# Patient Record
Sex: Female | Born: 1970 | Race: Black or African American | Hispanic: No | Marital: Single | Smoking: Never smoker
Health system: Southern US, Community
[De-identification: ages and names within clinical notes are randomized; demographics above are authoritative.]

## PROBLEM LIST (undated history)

## (undated) DIAGNOSIS — H9190 Unspecified hearing loss, unspecified ear: Secondary | ICD-10-CM

## (undated) DIAGNOSIS — F32A Depression, unspecified: Secondary | ICD-10-CM

## (undated) DIAGNOSIS — M1990 Unspecified osteoarthritis, unspecified site: Secondary | ICD-10-CM

## (undated) DIAGNOSIS — S82201C Unspecified fracture of shaft of right tibia, initial encounter for open fracture type IIIA, IIIB, or IIIC: Secondary | ICD-10-CM

## (undated) DIAGNOSIS — F419 Anxiety disorder, unspecified: Secondary | ICD-10-CM

## (undated) DIAGNOSIS — F329 Major depressive disorder, single episode, unspecified: Secondary | ICD-10-CM

## (undated) DIAGNOSIS — Z9289 Personal history of other medical treatment: Secondary | ICD-10-CM

## (undated) DIAGNOSIS — S82401C Unspecified fracture of shaft of right fibula, initial encounter for open fracture type IIIA, IIIB, or IIIC: Secondary | ICD-10-CM

## (undated) DIAGNOSIS — S42351A Displaced comminuted fracture of shaft of humerus, right arm, initial encounter for closed fracture: Secondary | ICD-10-CM

## (undated) HISTORY — PX: FRACTURE SURGERY: SHX138

## (undated) HISTORY — PX: GUM SURGERY: SHX658

---

## 2017-12-12 DIAGNOSIS — Z9289 Personal history of other medical treatment: Secondary | ICD-10-CM

## 2017-12-12 HISTORY — DX: Personal history of other medical treatment: Z92.89

## 2017-12-25 ENCOUNTER — Emergency Department (HOSPITAL_COMMUNITY): Payer: No Typology Code available for payment source

## 2017-12-25 ENCOUNTER — Other Ambulatory Visit: Payer: Self-pay

## 2017-12-25 ENCOUNTER — Encounter (HOSPITAL_COMMUNITY): Payer: Self-pay | Admitting: Emergency Medicine

## 2017-12-25 ENCOUNTER — Inpatient Hospital Stay (HOSPITAL_COMMUNITY)
Admission: EM | Admit: 2017-12-25 | Discharge: 2018-01-10 | DRG: 956 | Disposition: A | Discharge status: Rehabilitation Facility | Payer: No Typology Code available for payment source | Attending: General Surgery | Admitting: General Surgery

## 2017-12-25 DIAGNOSIS — S2249XA Multiple fractures of ribs, unspecified side, initial encounter for closed fracture: Secondary | ICD-10-CM | POA: Diagnosis not present

## 2017-12-25 DIAGNOSIS — G930 Cerebral cysts: Secondary | ICD-10-CM | POA: Diagnosis present

## 2017-12-25 DIAGNOSIS — Z6841 Body Mass Index (BMI) 40.0 and over, adult: Secondary | ICD-10-CM | POA: Diagnosis not present

## 2017-12-25 DIAGNOSIS — I6201 Nontraumatic acute subdural hemorrhage: Secondary | ICD-10-CM | POA: Diagnosis present

## 2017-12-25 DIAGNOSIS — S60511A Abrasion of right hand, initial encounter: Secondary | ICD-10-CM | POA: Diagnosis present

## 2017-12-25 DIAGNOSIS — S32029A Unspecified fracture of second lumbar vertebra, initial encounter for closed fracture: Secondary | ICD-10-CM | POA: Diagnosis present

## 2017-12-25 DIAGNOSIS — Z419 Encounter for procedure for purposes other than remedying health state, unspecified: Secondary | ICD-10-CM

## 2017-12-25 DIAGNOSIS — S299XXA Unspecified injury of thorax, initial encounter: Secondary | ICD-10-CM

## 2017-12-25 DIAGNOSIS — S82251B Displaced comminuted fracture of shaft of right tibia, initial encounter for open fracture type I or II: Principal | ICD-10-CM | POA: Diagnosis present

## 2017-12-25 DIAGNOSIS — T380X5A Adverse effect of glucocorticoids and synthetic analogues, initial encounter: Secondary | ICD-10-CM

## 2017-12-25 DIAGNOSIS — S42351A Displaced comminuted fracture of shaft of humerus, right arm, initial encounter for closed fracture: Secondary | ICD-10-CM | POA: Diagnosis present

## 2017-12-25 DIAGNOSIS — S36113A Laceration of liver, unspecified degree, initial encounter: Secondary | ICD-10-CM | POA: Diagnosis present

## 2017-12-25 DIAGNOSIS — S27321A Contusion of lung, unilateral, initial encounter: Secondary | ICD-10-CM | POA: Diagnosis present

## 2017-12-25 DIAGNOSIS — N179 Acute kidney failure, unspecified: Secondary | ICD-10-CM | POA: Diagnosis not present

## 2017-12-25 DIAGNOSIS — E872 Acidosis: Secondary | ICD-10-CM | POA: Diagnosis present

## 2017-12-25 DIAGNOSIS — S82251C Displaced comminuted fracture of shaft of right tibia, initial encounter for open fracture type IIIA, IIIB, or IIIC: Secondary | ICD-10-CM

## 2017-12-25 DIAGNOSIS — S62315A Displaced fracture of base of fourth metacarpal bone, left hand, initial encounter for closed fracture: Secondary | ICD-10-CM

## 2017-12-25 DIAGNOSIS — T148XXA Other injury of unspecified body region, initial encounter: Secondary | ICD-10-CM | POA: Diagnosis not present

## 2017-12-25 DIAGNOSIS — S271XXA Traumatic hemothorax, initial encounter: Secondary | ICD-10-CM | POA: Diagnosis present

## 2017-12-25 DIAGNOSIS — S82141B Displaced bicondylar fracture of right tibia, initial encounter for open fracture type I or II: Secondary | ICD-10-CM | POA: Diagnosis present

## 2017-12-25 DIAGNOSIS — R739 Hyperglycemia, unspecified: Secondary | ICD-10-CM

## 2017-12-25 DIAGNOSIS — S62333A Displaced fracture of neck of third metacarpal bone, left hand, initial encounter for closed fracture: Secondary | ICD-10-CM

## 2017-12-25 DIAGNOSIS — J90 Pleural effusion, not elsewhere classified: Secondary | ICD-10-CM | POA: Diagnosis not present

## 2017-12-25 DIAGNOSIS — J9601 Acute respiratory failure with hypoxia: Secondary | ICD-10-CM | POA: Diagnosis present

## 2017-12-25 DIAGNOSIS — S2239XA Fracture of one rib, unspecified side, initial encounter for closed fracture: Secondary | ICD-10-CM

## 2017-12-25 DIAGNOSIS — M21371 Foot drop, right foot: Secondary | ICD-10-CM | POA: Diagnosis present

## 2017-12-25 DIAGNOSIS — S2241XA Multiple fractures of ribs, right side, initial encounter for closed fracture: Secondary | ICD-10-CM | POA: Diagnosis present

## 2017-12-25 DIAGNOSIS — S27329A Contusion of lung, unspecified, initial encounter: Secondary | ICD-10-CM

## 2017-12-25 DIAGNOSIS — Z9911 Dependence on respirator [ventilator] status: Secondary | ICD-10-CM | POA: Diagnosis not present

## 2017-12-25 DIAGNOSIS — S32039A Unspecified fracture of third lumbar vertebra, initial encounter for closed fracture: Secondary | ICD-10-CM | POA: Diagnosis present

## 2017-12-25 DIAGNOSIS — S36116A Major laceration of liver, initial encounter: Secondary | ICD-10-CM | POA: Diagnosis present

## 2017-12-25 DIAGNOSIS — S36112A Contusion of liver, initial encounter: Secondary | ICD-10-CM | POA: Diagnosis present

## 2017-12-25 DIAGNOSIS — D62 Acute posthemorrhagic anemia: Secondary | ICD-10-CM

## 2017-12-25 DIAGNOSIS — S42309A Unspecified fracture of shaft of humerus, unspecified arm, initial encounter for closed fracture: Secondary | ICD-10-CM

## 2017-12-25 DIAGNOSIS — R578 Other shock: Secondary | ICD-10-CM | POA: Diagnosis present

## 2017-12-25 DIAGNOSIS — S82141A Displaced bicondylar fracture of right tibia, initial encounter for closed fracture: Secondary | ICD-10-CM

## 2017-12-25 DIAGNOSIS — D696 Thrombocytopenia, unspecified: Secondary | ICD-10-CM | POA: Diagnosis present

## 2017-12-25 DIAGNOSIS — I6200 Nontraumatic subdural hemorrhage, unspecified: Secondary | ICD-10-CM

## 2017-12-25 DIAGNOSIS — S62331A Displaced fracture of neck of second metacarpal bone, left hand, initial encounter for closed fracture: Secondary | ICD-10-CM

## 2017-12-25 DIAGNOSIS — G8918 Other acute postprocedural pain: Secondary | ICD-10-CM

## 2017-12-25 DIAGNOSIS — S60512A Abrasion of left hand, initial encounter: Secondary | ICD-10-CM | POA: Diagnosis present

## 2017-12-25 DIAGNOSIS — S82143A Displaced bicondylar fracture of unspecified tibia, initial encounter for closed fracture: Secondary | ICD-10-CM

## 2017-12-25 DIAGNOSIS — R571 Hypovolemic shock: Secondary | ICD-10-CM | POA: Diagnosis present

## 2017-12-25 DIAGNOSIS — S069X9A Unspecified intracranial injury with loss of consciousness of unspecified duration, initial encounter: Secondary | ICD-10-CM | POA: Diagnosis not present

## 2017-12-25 DIAGNOSIS — S065X9A Traumatic subdural hemorrhage with loss of consciousness of unspecified duration, initial encounter: Secondary | ICD-10-CM

## 2017-12-25 DIAGNOSIS — S62668A Nondisplaced fracture of distal phalanx of other finger, initial encounter for closed fracture: Secondary | ICD-10-CM | POA: Diagnosis present

## 2017-12-25 DIAGNOSIS — S81809A Unspecified open wound, unspecified lower leg, initial encounter: Secondary | ICD-10-CM

## 2017-12-25 DIAGNOSIS — J9690 Respiratory failure, unspecified, unspecified whether with hypoxia or hypercapnia: Secondary | ICD-10-CM

## 2017-12-25 DIAGNOSIS — S065XAA Traumatic subdural hemorrhage with loss of consciousness status unknown, initial encounter: Secondary | ICD-10-CM

## 2017-12-25 DIAGNOSIS — S82142A Displaced bicondylar fracture of left tibia, initial encounter for closed fracture: Secondary | ICD-10-CM

## 2017-12-25 DIAGNOSIS — D72829 Elevated white blood cell count, unspecified: Secondary | ICD-10-CM

## 2017-12-25 DIAGNOSIS — S8011XA Contusion of right lower leg, initial encounter: Secondary | ICD-10-CM | POA: Diagnosis present

## 2017-12-25 DIAGNOSIS — H9190 Unspecified hearing loss, unspecified ear: Secondary | ICD-10-CM | POA: Diagnosis present

## 2017-12-25 DIAGNOSIS — S82141C Displaced bicondylar fracture of right tibia, initial encounter for open fracture type IIIA, IIIB, or IIIC: Secondary | ICD-10-CM | POA: Diagnosis present

## 2017-12-25 HISTORY — DX: Unspecified fracture of shaft of right fibula, initial encounter for open fracture type IIIA, IIIB, or IIIC: S82.401C

## 2017-12-25 HISTORY — DX: Major depressive disorder, single episode, unspecified: F32.9

## 2017-12-25 HISTORY — DX: Unspecified hearing loss, unspecified ear: H91.90

## 2017-12-25 HISTORY — DX: Displaced comminuted fracture of shaft of humerus, right arm, initial encounter for closed fracture: S42.351A

## 2017-12-25 HISTORY — DX: Personal history of other medical treatment: Z92.89

## 2017-12-25 HISTORY — DX: Depression, unspecified: F32.A

## 2017-12-25 HISTORY — DX: Unspecified fracture of shaft of right tibia, initial encounter for open fracture type IIIA, IIIB, or IIIC: S82.201C

## 2017-12-25 HISTORY — DX: Unspecified osteoarthritis, unspecified site: M19.90

## 2017-12-25 HISTORY — DX: Anxiety disorder, unspecified: F41.9

## 2017-12-25 LAB — CBC
HCT: 33.9 % — ABNORMAL LOW (ref 36.0–46.0)
HEMOGLOBIN: 9.7 g/dL — AB (ref 12.0–15.0)
MCH: 29.4 pg (ref 26.0–34.0)
MCHC: 28.6 g/dL — ABNORMAL LOW (ref 30.0–36.0)
MCV: 102.7 fL — ABNORMAL HIGH (ref 78.0–100.0)
Platelets: 246 10*3/uL (ref 150–400)
RBC: 3.3 MIL/uL — AB (ref 3.87–5.11)
RDW: 12.8 % (ref 11.5–15.5)
WBC: 11.7 10*3/uL — AB (ref 4.0–10.5)

## 2017-12-25 LAB — URINALYSIS, ROUTINE W REFLEX MICROSCOPIC
BILIRUBIN URINE: NEGATIVE
GLUCOSE, UA: 150 mg/dL — AB
KETONES UR: NEGATIVE mg/dL
LEUKOCYTES UA: NEGATIVE
NITRITE: NEGATIVE
PH: 6 (ref 5.0–8.0)
Protein, ur: 100 mg/dL — AB
Specific Gravity, Urine: 1.032 — ABNORMAL HIGH (ref 1.005–1.030)

## 2017-12-25 LAB — I-STAT ARTERIAL BLOOD GAS, ED
Acid-base deficit: 16 mmol/L — ABNORMAL HIGH (ref 0.0–2.0)
Bicarbonate: 12.2 mmol/L — ABNORMAL LOW (ref 20.0–28.0)
O2 SAT: 98 %
PCO2 ART: 35.7 mmHg (ref 32.0–48.0)
PH ART: 7.139 — AB (ref 7.350–7.450)
TCO2: 13 mmol/L — AB (ref 22–32)
pO2, Arterial: 130 mmHg — ABNORMAL HIGH (ref 83.0–108.0)

## 2017-12-25 LAB — COMPREHENSIVE METABOLIC PANEL
ALT: 1012 U/L — ABNORMAL HIGH (ref 0–44)
ANION GAP: 24 — AB (ref 5–15)
AST: 943 U/L — ABNORMAL HIGH (ref 15–41)
Albumin: 3.3 g/dL — ABNORMAL LOW (ref 3.5–5.0)
Alkaline Phosphatase: 107 U/L (ref 38–126)
BILIRUBIN TOTAL: 0.5 mg/dL (ref 0.3–1.2)
BUN: 18 mg/dL (ref 6–20)
CO2: 12 mmol/L — AB (ref 22–32)
Calcium: 8.5 mg/dL — ABNORMAL LOW (ref 8.9–10.3)
Chloride: 104 mmol/L (ref 98–111)
Creatinine, Ser: 1.32 mg/dL — ABNORMAL HIGH (ref 0.44–1.00)
GFR calc non Af Amer: 47 mL/min — ABNORMAL LOW (ref >60)
GFR, EST AFRICAN AMERICAN: 54 mL/min — AB (ref >60)
Glucose, Bld: 294 mg/dL — ABNORMAL HIGH (ref 70–99)
Potassium: 4.2 mmol/L (ref 3.5–5.1)
SODIUM: 140 mmol/L (ref 135–145)
TOTAL PROTEIN: 6.6 g/dL (ref 6.5–8.1)

## 2017-12-25 LAB — I-STAT CG4 LACTIC ACID, ED
LACTIC ACID, VENOUS: 3.43 mmol/L — AB (ref 0.5–1.9)
Lactic Acid, Venous: 15.86 mmol/L (ref 0.5–1.9)

## 2017-12-25 LAB — PROTIME-INR
INR: 1.29
Prothrombin Time: 16 seconds — ABNORMAL HIGH (ref 11.4–15.2)

## 2017-12-25 LAB — ETHANOL: Alcohol, Ethyl (B): <10 mg/dL (ref <10)

## 2017-12-25 LAB — I-STAT CHEM 8, ED
BUN: 24 mg/dL — ABNORMAL HIGH (ref 6–20)
CALCIUM ION: 1.07 mmol/L — AB (ref 1.15–1.40)
Chloride: 105 mmol/L (ref 98–111)
Creatinine, Ser: 1.2 mg/dL — ABNORMAL HIGH (ref 0.44–1.00)
GLUCOSE: 278 mg/dL — AB (ref 70–99)
HCT: 31 % — ABNORMAL LOW (ref 36.0–46.0)
Hemoglobin: 10.5 g/dL — ABNORMAL LOW (ref 12.0–15.0)
Potassium: 4.3 mmol/L (ref 3.5–5.1)
Sodium: 139 mmol/L (ref 135–145)
TCO2: 15 mmol/L — ABNORMAL LOW (ref 22–32)

## 2017-12-25 LAB — ABO/RH: ABO/RH(D): O POS

## 2017-12-25 MED ORDER — TETANUS-DIPHTH-ACELL PERTUSSIS 5-2.5-18.5 LF-MCG/0.5 IM SUSP
INTRAMUSCULAR | Status: AC
  Filled 2017-12-25: qty 0.5

## 2017-12-25 MED ORDER — IOHEXOL 300 MG/ML  SOLN
100.0000 mL | Freq: Once | INTRAMUSCULAR | Status: AC | PRN
  Administered 2017-12-25: 100 mL via INTRAVENOUS

## 2017-12-25 MED ORDER — MIDAZOLAM HCL 2 MG/2ML IJ SOLN
INTRAMUSCULAR | Status: AC
  Administered 2017-12-25: 2 mg
  Filled 2017-12-25: qty 6

## 2017-12-25 MED ORDER — CHLORHEXIDINE GLUCONATE 0.12% ORAL RINSE (MEDLINE KIT)
15.0000 mL | Freq: Two times a day (BID) | OROMUCOSAL | Status: DC
  Administered 2017-12-25 – 2018-01-02 (×15): 15 mL via OROMUCOSAL

## 2017-12-25 MED ORDER — FENTANYL BOLUS VIA INFUSION
50.0000 ug | INTRAVENOUS | Status: DC | PRN
  Filled 2017-12-25: qty 50

## 2017-12-25 MED ORDER — PROPOFOL 1000 MG/100ML IV EMUL
INTRAVENOUS | Status: AC
  Filled 2017-12-25: qty 100

## 2017-12-25 MED ORDER — TRANEXAMIC ACID 1000 MG/10ML IV SOLN
1000.0000 mg | Freq: Once | INTRAVENOUS | Status: AC
  Administered 2017-12-25: 1000 mg via INTRAVENOUS
  Filled 2017-12-25: qty 10

## 2017-12-25 MED ORDER — PROPOFOL 1000 MG/100ML IV EMUL
0.0000 ug/kg/min | INTRAVENOUS | Status: DC
  Administered 2017-12-25: 5 ug/kg/min via INTRAVENOUS
  Administered 2017-12-26: 20 ug/kg/min via INTRAVENOUS
  Administered 2017-12-26: 5 ug/kg/min via INTRAVENOUS
  Administered 2017-12-27: 35 ug/kg/min via INTRAVENOUS
  Administered 2017-12-27 (×3): 15 ug/kg/min via INTRAVENOUS
  Administered 2017-12-28: 5 ug/kg/min via INTRAVENOUS
  Filled 2017-12-25 (×5): qty 100

## 2017-12-25 MED ORDER — POTASSIUM CHLORIDE IN NACL 20-0.9 MEQ/L-% IV SOLN
INTRAVENOUS | Status: DC
  Administered 2017-12-26 – 2018-01-06 (×14): via INTRAVENOUS
  Filled 2017-12-25 (×17): qty 1000

## 2017-12-25 MED ORDER — FENTANYL CITRATE (PF) 100 MCG/2ML IJ SOLN
INTRAMUSCULAR | Status: AC
  Administered 2017-12-25: 50 ug
  Filled 2017-12-25: qty 2

## 2017-12-25 MED ORDER — FENTANYL 2500MCG IN NS 250ML (10MCG/ML) PREMIX INFUSION
25.0000 ug/h | INTRAVENOUS | Status: DC
  Administered 2017-12-25: 25 ug/h via INTRAVENOUS
  Administered 2017-12-25: 50 ug/h via INTRAVENOUS
  Filled 2017-12-25: qty 250

## 2017-12-25 MED ORDER — SODIUM CHLORIDE 0.9 % IV SOLN
INTRAVENOUS | Status: AC | PRN
  Administered 2017-12-25: 1500 mL via INTRAVENOUS

## 2017-12-25 MED ORDER — DOCUSATE SODIUM 50 MG/5ML PO LIQD
100.0000 mg | Freq: Two times a day (BID) | ORAL | Status: DC | PRN
  Administered 2017-12-27 – 2018-01-01 (×5): 100 mg
  Filled 2017-12-25 (×6): qty 10

## 2017-12-25 MED ORDER — ETOMIDATE 2 MG/ML IV SOLN
INTRAVENOUS | Status: AC | PRN
  Administered 2017-12-25: 30 mg via INTRAVENOUS

## 2017-12-25 MED ORDER — FENTANYL BOLUS VIA INFUSION
50.0000 ug | INTRAVENOUS | Status: DC | PRN
  Administered 2017-12-26 – 2017-12-29 (×4): 50 ug via INTRAVENOUS
  Filled 2017-12-25 (×2): qty 50

## 2017-12-25 MED ORDER — SODIUM BICARBONATE 8.4 % IV SOLN
100.0000 meq | Freq: Once | INTRAVENOUS | Status: AC
  Administered 2017-12-25: 100 meq via INTRAVENOUS

## 2017-12-25 MED ORDER — CEFAZOLIN SODIUM-DEXTROSE 2-4 GM/100ML-% IV SOLN
INTRAVENOUS | Status: AC
  Administered 2017-12-25: 2000 mg
  Filled 2017-12-25: qty 100

## 2017-12-25 MED ORDER — TRANEXAMIC ACID 1000 MG/10ML IV SOLN
1000.0000 mg | INTRAVENOUS | Status: AC
  Administered 2017-12-25: 1000 mg via INTRAVENOUS
  Filled 2017-12-25: qty 10

## 2017-12-25 MED ORDER — NOREPINEPHRINE 4 MG/250ML-% IV SOLN
0.0000 ug/min | INTRAVENOUS | Status: DC
  Administered 2017-12-25 – 2017-12-26 (×2): 5 ug/min via INTRAVENOUS
  Filled 2017-12-25: qty 250

## 2017-12-25 MED ORDER — SODIUM BICARBONATE 8.4 % IV SOLN
INTRAVENOUS | Status: AC
  Filled 2017-12-25: qty 100

## 2017-12-25 MED ORDER — CEFAZOLIN SODIUM-DEXTROSE 1-4 GM/50ML-% IV SOLN
1.0000 g | Freq: Three times a day (TID) | INTRAVENOUS | Status: DC
  Administered 2017-12-26: 1 g via INTRAVENOUS
  Filled 2017-12-25: qty 50

## 2017-12-25 MED ORDER — FENTANYL CITRATE (PF) 100 MCG/2ML IJ SOLN
INTRAMUSCULAR | Status: AC | PRN
  Administered 2017-12-25: 75 ug via INTRAVENOUS

## 2017-12-25 MED ORDER — SUCCINYLCHOLINE CHLORIDE 20 MG/ML IJ SOLN
INTRAMUSCULAR | Status: AC | PRN
  Administered 2017-12-25: 150 mg via INTRAVENOUS

## 2017-12-25 MED ORDER — FENTANYL CITRATE (PF) 100 MCG/2ML IJ SOLN
50.0000 ug | Freq: Once | INTRAMUSCULAR | Status: AC
  Administered 2017-12-25: 50 ug via INTRAVENOUS

## 2017-12-25 MED ORDER — SODIUM CHLORIDE 0.9 % IV SOLN
INTRAVENOUS | Status: AC | PRN
  Administered 2017-12-25: 500 mL via INTRAVENOUS

## 2017-12-25 MED ORDER — FENTANYL CITRATE (PF) 100 MCG/2ML IJ SOLN
INTRAMUSCULAR | Status: AC
  Filled 2017-12-25: qty 2

## 2017-12-25 MED ORDER — ORAL CARE MOUTH RINSE
15.0000 mL | OROMUCOSAL | Status: DC
  Administered 2017-12-26 – 2018-01-02 (×71): 15 mL via OROMUCOSAL

## 2017-12-25 MED ORDER — TETANUS-DIPHTH-ACELL PERTUSSIS 5-2.5-18.5 LF-MCG/0.5 IM SUSP
0.5000 mL | Freq: Once | INTRAMUSCULAR | Status: AC
  Administered 2017-12-25: 0.5 mL via INTRAMUSCULAR

## 2017-12-25 MED ORDER — MIDAZOLAM HCL 2 MG/2ML IJ SOLN
INTRAMUSCULAR | Status: AC
  Filled 2017-12-25: qty 2

## 2017-12-25 MED ORDER — SODIUM CHLORIDE 0.9 % IV BOLUS
125.0000 mL | Freq: Once | INTRAVENOUS | Status: AC
  Administered 2017-12-25: 1000 mL via INTRAVENOUS

## 2017-12-25 MED ORDER — FENTANYL 2500MCG IN NS 250ML (10MCG/ML) PREMIX INFUSION
25.0000 ug/h | INTRAVENOUS | Status: DC
  Administered 2017-12-26: 50 ug/h via INTRAVENOUS
  Administered 2017-12-26: 175 ug/h via INTRAVENOUS
  Administered 2017-12-27 – 2017-12-28 (×2): 150 ug/h via INTRAVENOUS
  Administered 2017-12-29: 75 ug/h via INTRAVENOUS
  Administered 2017-12-30: 125 ug/h via INTRAVENOUS
  Administered 2017-12-31: 75 ug/h via INTRAVENOUS
  Administered 2017-12-31: 150 ug/h via INTRAVENOUS
  Administered 2018-01-02: 100 ug/h via INTRAVENOUS
  Filled 2017-12-25 (×9): qty 250
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
## 2017-12-25 NOTE — Progress Notes (Signed)
Orthopedic Tech Progress Note Patient Details:  Jerrel Ivoryarrows Sss Doe 05/14/1877 [id number]  Patient ID: Lynett FishNarrows Sss Doe, female   DOB: 05/14/1877, [age] y.o.   MRN: [id number]   [name], Leonte Horrigan 12/25/2017, 7:17 PM Made level 1 trauma visit

## 2017-12-26 ENCOUNTER — Inpatient Hospital Stay (HOSPITAL_COMMUNITY): Payer: No Typology Code available for payment source | Admitting: Certified Registered Nurse Anesthetist

## 2017-12-26 ENCOUNTER — Inpatient Hospital Stay (HOSPITAL_COMMUNITY): Payer: No Typology Code available for payment source

## 2017-12-26 ENCOUNTER — Encounter (HOSPITAL_COMMUNITY): Admission: EM | Disposition: A | Discharge status: Rehabilitation Facility | Payer: Self-pay | Source: Home / Self Care

## 2017-12-26 HISTORY — PX: EXTERNAL FIXATION LEG: SHX1549

## 2017-12-26 HISTORY — PX: I & D EXTREMITY: SHX5045

## 2017-12-26 LAB — CBC
HCT: 32.8 % — ABNORMAL LOW (ref 36.0–46.0)
HCT: 24.7 % — ABNORMAL LOW (ref 36.0–46.0)
HEMATOCRIT: 33.5 % — AB (ref 36.0–46.0)
HEMATOCRIT: 33.5 % — AB (ref 36.0–46.0)
HEMOGLOBIN: 11.1 g/dL — AB (ref 12.0–15.0)
HEMOGLOBIN: 11 g/dL — AB (ref 12.0–15.0)
HEMOGLOBIN: 8 g/dL — AB (ref 12.0–15.0)
Hemoglobin: 10.9 g/dL — ABNORMAL LOW (ref 12.0–15.0)
MCH: 29.6 pg (ref 26.0–34.0)
MCH: 29.2 pg (ref 26.0–34.0)
MCH: 29.3 pg (ref 26.0–34.0)
MCH: 29.2 pg (ref 26.0–34.0)
MCHC: 33.1 g/dL (ref 30.0–36.0)
MCHC: 32.8 g/dL (ref 30.0–36.0)
MCHC: 33.2 g/dL (ref 30.0–36.0)
MCHC: 32.4 g/dL (ref 30.0–36.0)
MCV: 89.3 fL (ref 78.0–100.0)
MCV: 88.9 fL (ref 78.0–100.0)
MCV: 88.2 fL (ref 78.0–100.0)
MCV: 90.1 fL (ref 78.0–100.0)
PLATELETS: 152 10*3/uL (ref 150–400)
PLATELETS: DECREASED 10*3/uL (ref 150–400)
Platelets: 168 10*3/uL (ref 150–400)
Platelets: 168 10*3/uL (ref 150–400)
RBC: 3.75 MIL/uL — ABNORMAL LOW (ref 3.87–5.11)
RBC: 3.77 MIL/uL — AB (ref 3.87–5.11)
RBC: 3.72 MIL/uL — ABNORMAL LOW (ref 3.87–5.11)
RBC: 2.74 MIL/uL — AB (ref 3.87–5.11)
RDW: 13.7 % (ref 11.5–15.5)
RDW: 14.1 % (ref 11.5–15.5)
RDW: 14.6 % (ref 11.5–15.5)
RDW: 15 % (ref 11.5–15.5)
WBC: 12.7 10*3/uL — AB (ref 4.0–10.5)
WBC: 12.3 10*3/uL — ABNORMAL HIGH (ref 4.0–10.5)
WBC: 12.4 10*3/uL — AB (ref 4.0–10.5)
WBC: 8.5 10*3/uL (ref 4.0–10.5)

## 2017-12-26 LAB — PREPARE PLATELET PHERESIS: Unit division: 0

## 2017-12-26 LAB — COMPREHENSIVE METABOLIC PANEL
ALBUMIN: 2.9 g/dL — AB (ref 3.5–5.0)
ALBUMIN: 2.8 g/dL — AB (ref 3.5–5.0)
ALK PHOS: 64 U/L (ref 38–126)
ALT: 718 U/L — AB (ref 0–44)
ALT: 738 U/L — AB (ref 0–44)
ANION GAP: 9 (ref 5–15)
AST: 1071 U/L — ABNORMAL HIGH (ref 15–41)
AST: 1194 U/L — AB (ref 15–41)
Alkaline Phosphatase: 61 U/L (ref 38–126)
Anion gap: 10 (ref 5–15)
BILIRUBIN TOTAL: 0.9 mg/dL (ref 0.3–1.2)
BILIRUBIN TOTAL: 0.9 mg/dL (ref 0.3–1.2)
BUN: 16 mg/dL (ref 6–20)
BUN: 16 mg/dL (ref 6–20)
CALCIUM: 6.8 mg/dL — AB (ref 8.9–10.3)
CALCIUM: 6.8 mg/dL — AB (ref 8.9–10.3)
CO2: 24 mmol/L (ref 22–32)
CO2: 24 mmol/L (ref 22–32)
CREATININE: 1.32 mg/dL — AB (ref 0.44–1.00)
Chloride: 111 mmol/L (ref 98–111)
Chloride: 107 mmol/L (ref 98–111)
Creatinine, Ser: 1.45 mg/dL — ABNORMAL HIGH (ref 0.44–1.00)
GFR calc Af Amer: 48 mL/min — ABNORMAL LOW (ref >60)
GFR calc non Af Amer: 47 mL/min — ABNORMAL LOW (ref >60)
GFR calc non Af Amer: 42 mL/min — ABNORMAL LOW (ref >60)
GFR, EST AFRICAN AMERICAN: 54 mL/min — AB (ref >60)
GLUCOSE: 169 mg/dL — AB (ref 70–99)
GLUCOSE: 228 mg/dL — AB (ref 70–99)
Potassium: 4 mmol/L (ref 3.5–5.1)
Potassium: 4 mmol/L (ref 3.5–5.1)
Sodium: 144 mmol/L (ref 135–145)
Sodium: 141 mmol/L (ref 135–145)
TOTAL PROTEIN: 5.1 g/dL — AB (ref 6.5–8.1)
TOTAL PROTEIN: 5.2 g/dL — AB (ref 6.5–8.1)

## 2017-12-26 LAB — PROTIME-INR
INR: 1.29
INR: 1.26
Prothrombin Time: 16 seconds — ABNORMAL HIGH (ref 11.4–15.2)
Prothrombin Time: 15.7 seconds — ABNORMAL HIGH (ref 11.4–15.2)

## 2017-12-26 LAB — BPAM PLATELET PHERESIS
Blood Product Expiration Date: 201908152359
ISSUE DATE / TIME: 201908142108
Unit Type and Rh: 6200

## 2017-12-26 LAB — BPAM FFP
BLOOD PRODUCT EXPIRATION DATE: 201908182359
BLOOD PRODUCT EXPIRATION DATE: 201908182359
BLOOD PRODUCT EXPIRATION DATE: 201908182359
Blood Product Expiration Date: 201908182359
Blood Product Expiration Date: 201908182359
Blood Product Expiration Date: 201908182359
ISSUE DATE / TIME: 201908141901
ISSUE DATE / TIME: 201908142024
ISSUE DATE / TIME: 201908142024
ISSUE DATE / TIME: 201908142108
ISSUE DATE / TIME: 201908141901
ISSUE DATE / TIME: 201908142108
UNIT TYPE AND RH: 600
UNIT TYPE AND RH: 6200
UNIT TYPE AND RH: 6200
Unit Type and Rh: 6200
Unit Type and Rh: 6200
Unit Type and Rh: 6200

## 2017-12-26 LAB — POCT I-STAT 3, ART BLOOD GAS (G3+)
ACID-BASE DEFICIT: 7 mmol/L — AB (ref 0.0–2.0)
Bicarbonate: 20.1 mmol/L (ref 20.0–28.0)
O2 SAT: 100 %
PO2 ART: 314 mmHg — AB (ref 83.0–108.0)
Patient temperature: 37.1
TCO2: 21 mmol/L — ABNORMAL LOW (ref 22–32)
pCO2 arterial: 45.8 mmHg (ref 32.0–48.0)
pH, Arterial: 7.25 — ABNORMAL LOW (ref 7.350–7.450)

## 2017-12-26 LAB — PREPARE FRESH FROZEN PLASMA
UNIT DIVISION: 0
UNIT DIVISION: 0
Unit division: 0

## 2017-12-26 LAB — LACTIC ACID, PLASMA
Lactic Acid, Venous: 3.1 mmol/L (ref 0.5–1.9)
Lactic Acid, Venous: 2.3 mmol/L (ref 0.5–1.9)

## 2017-12-26 LAB — BLOOD PRODUCT ORDER (VERBAL) VERIFICATION

## 2017-12-26 LAB — TRIGLYCERIDES: Triglycerides: 103 mg/dL (ref <150)

## 2017-12-26 LAB — CDS SEROLOGY

## 2017-12-26 LAB — GLUCOSE, CAPILLARY: GLUCOSE-CAPILLARY: 125 mg/dL — AB (ref 70–99)

## 2017-12-26 LAB — SURGICAL PCR SCREEN
MRSA, PCR: NEGATIVE
Staphylococcus aureus: NEGATIVE

## 2017-12-26 LAB — MRSA PCR SCREENING: MRSA BY PCR: NEGATIVE

## 2017-12-26 SURGERY — EXTERNAL FIXATION, LOWER EXTREMITY
Anesthesia: General | Laterality: Right

## 2017-12-26 MED ORDER — INSULIN ASPART 100 UNIT/ML ~~LOC~~ SOLN
0.0000 [IU] | SUBCUTANEOUS | Status: DC
  Administered 2017-12-26: 1 [IU] via SUBCUTANEOUS
  Administered 2017-12-28 – 2017-12-31 (×9): 2 [IU] via SUBCUTANEOUS
  Administered 2018-01-01: 3 [IU] via SUBCUTANEOUS
  Administered 2018-01-02 – 2018-01-05 (×6): 2 [IU] via SUBCUTANEOUS
  Administered 2018-01-05: 3 [IU] via SUBCUTANEOUS
  Administered 2018-01-06 – 2018-01-08 (×3): 2 [IU] via SUBCUTANEOUS

## 2017-12-26 MED ORDER — LACTATED RINGERS IV SOLN
INTRAVENOUS | Status: DC | PRN
  Administered 2017-12-26: 14:00:00 via INTRAVENOUS

## 2017-12-26 MED ORDER — VANCOMYCIN HCL 1000 MG IV SOLR
INTRAVENOUS | Status: AC
  Filled 2017-12-26: qty 1000

## 2017-12-26 MED ORDER — TOBRAMYCIN SULFATE 1.2 G IJ SOLR
INTRAMUSCULAR | Status: DC | PRN
  Administered 2017-12-26 (×2): 1.2 g

## 2017-12-26 MED ORDER — PHENYLEPHRINE HCL-NACL 10-0.9 MG/250ML-% IV SOLN
0.0000 ug/min | INTRAVENOUS | Status: DC
  Administered 2017-12-27: 70 ug/min via INTRAVENOUS
  Administered 2017-12-27: 100 ug/min via INTRAVENOUS
  Administered 2017-12-27: 85 ug/min via INTRAVENOUS
  Administered 2017-12-27: 30 ug/min via INTRAVENOUS
  Administered 2017-12-27: 80 ug/min via INTRAVENOUS
  Filled 2017-12-26 (×5): qty 250

## 2017-12-26 MED ORDER — ALBUMIN HUMAN 5 % IV SOLN
INTRAVENOUS | Status: DC | PRN
  Administered 2017-12-26: 15:00:00 via INTRAVENOUS

## 2017-12-26 MED ORDER — ROCURONIUM BROMIDE 50 MG/5ML IV SOSY
PREFILLED_SYRINGE | INTRAVENOUS | Status: AC
  Filled 2017-12-26: qty 10

## 2017-12-26 MED ORDER — SODIUM CHLORIDE 0.9 % IV SOLN
INTRAVENOUS | Status: DC | PRN
  Administered 2017-12-26: 40 ug/min via INTRAVENOUS
  Administered 2017-12-26: 20 ug/min via INTRAVENOUS

## 2017-12-26 MED ORDER — CHLORHEXIDINE GLUCONATE 0.12 % MT SOLN
OROMUCOSAL | Status: AC
  Filled 2017-12-26: qty 15

## 2017-12-26 MED ORDER — MIDAZOLAM HCL 2 MG/2ML IJ SOLN
INTRAMUSCULAR | Status: AC
  Filled 2017-12-26: qty 2

## 2017-12-26 MED ORDER — 0.9 % SODIUM CHLORIDE (POUR BTL) OPTIME
TOPICAL | Status: DC | PRN
  Administered 2017-12-26: 2000 mL

## 2017-12-26 MED ORDER — VANCOMYCIN HCL 1000 MG IV SOLR
INTRAVENOUS | Status: DC | PRN
  Administered 2017-12-26 (×3): 1000 mg

## 2017-12-26 MED ORDER — MUPIROCIN 2 % EX OINT: 1.0000 "application " | TOPICAL_OINTMENT | Freq: Two times a day (BID) | CUTANEOUS | Status: DC

## 2017-12-26 MED ORDER — ONDANSETRON HCL 4 MG/2ML IJ SOLN
INTRAMUSCULAR | Status: AC
Start: 2017-12-26 — End: ?
  Filled 2017-12-26: qty 2

## 2017-12-26 MED ORDER — TOBRAMYCIN SULFATE 1.2 G IJ SOLR
INTRAMUSCULAR | Status: AC
  Filled 2017-12-26: qty 2.4

## 2017-12-26 MED ORDER — SODIUM CHLORIDE 0.9 % IR SOLN
Status: DC | PRN
  Administered 2017-12-26: 12000 mL

## 2017-12-26 MED ORDER — PHENYLEPHRINE 40 MCG/ML (10ML) SYRINGE FOR IV PUSH (FOR BLOOD PRESSURE SUPPORT)
PREFILLED_SYRINGE | INTRAVENOUS | Status: DC | PRN
  Administered 2017-12-26: 80 ug via INTRAVENOUS
  Administered 2017-12-26 (×4): 120 ug via INTRAVENOUS

## 2017-12-26 MED ORDER — ROCURONIUM BROMIDE 10 MG/ML (PF) SYRINGE
PREFILLED_SYRINGE | INTRAVENOUS | Status: DC | PRN
  Administered 2017-12-26: 20 mg via INTRAVENOUS
  Administered 2017-12-26: 10 mg via INTRAVENOUS
  Administered 2017-12-26 (×2): 20 mg via INTRAVENOUS
  Administered 2017-12-26: 50 mg via INTRAVENOUS

## 2017-12-26 MED ORDER — ROCURONIUM BROMIDE 50 MG/5ML IV SOSY
PREFILLED_SYRINGE | INTRAVENOUS | Status: AC
Start: 2017-12-26 — End: ?
  Filled 2017-12-26: qty 5

## 2017-12-26 MED ORDER — CEFAZOLIN SODIUM-DEXTROSE 2-4 GM/100ML-% IV SOLN
2.0000 g | Freq: Three times a day (TID) | INTRAVENOUS | Status: DC
  Administered 2017-12-26: 2 g via INTRAVENOUS
  Filled 2017-12-26 (×2): qty 100

## 2017-12-26 MED ORDER — VANCOMYCIN HCL 1000 MG IV SOLR
INTRAVENOUS | Status: AC
  Filled 2017-12-26: qty 2000

## 2017-12-26 MED ORDER — SODIUM CHLORIDE 0.9 % IV SOLN
2.0000 g | INTRAVENOUS | Status: DC
  Administered 2017-12-26 – 2018-01-01 (×7): 2 g via INTRAVENOUS
  Filled 2017-12-26 (×7): qty 20

## 2017-12-26 MED ORDER — ALBUMIN HUMAN 5 % IV SOLN
25.0000 g | Freq: Once | INTRAVENOUS | Status: AC
  Administered 2017-12-26: 25 g via INTRAVENOUS
  Filled 2017-12-26: qty 500

## 2017-12-26 MED ORDER — MIDAZOLAM HCL 2 MG/2ML IJ SOLN
INTRAMUSCULAR | Status: DC | PRN
  Administered 2017-12-26: 2 mg via INTRAVENOUS

## 2017-12-26 MED ORDER — PHENYLEPHRINE 40 MCG/ML (10ML) SYRINGE FOR IV PUSH (FOR BLOOD PRESSURE SUPPORT)
PREFILLED_SYRINGE | INTRAVENOUS | Status: AC
  Filled 2017-12-26: qty 20

## 2017-12-26 SURGICAL SUPPLY — 55 items
BNDG COHESIVE 4X5 TAN STRL (GAUZE/BANDAGES/DRESSINGS) ×3 IMPLANT
BNDG CONFORM 2 STRL LF (GAUZE/BANDAGES/DRESSINGS) ×3 IMPLANT
BNDG GAUZE ELAST 4 BULKY (GAUZE/BANDAGES/DRESSINGS) ×3 IMPLANT
BNDG STRETCH 4X75 NS LF (GAUZE/BANDAGES/DRESSINGS) ×3 IMPLANT
BOWL SMART MIX CTS (DISPOSABLE) ×3 IMPLANT
CEMENT HV SMART SET (Cement) ×3 IMPLANT
CHLORAPREP W/TINT 26ML (MISCELLANEOUS) ×6 IMPLANT
CLAMP LG MULTI PIN (Clamp) ×6 IMPLANT
CLAMP ROD ATTACHMENT (Clamp) ×6 IMPLANT
COVER SURGICAL LIGHT HANDLE (MISCELLANEOUS) ×3 IMPLANT
DRAPE C-ARM 42X72 X-RAY (DRAPES) ×3 IMPLANT
DRAPE C-ARMOR (DRAPES) ×3 IMPLANT
DRAPE ORTHO SPLIT 77X108 STRL (DRAPES) ×4
DRAPE SURG 17X23 STRL (DRAPES) ×3 IMPLANT
DRAPE SURG ORHT 6 SPLT 77X108 (DRAPES) ×2 IMPLANT
DRAPE U-SHAPE 47X51 STRL (DRAPES) ×3 IMPLANT
DRESSING VERAFLO CLEANSE CC (GAUZE/BANDAGES/DRESSINGS) ×1 IMPLANT
DRSG MEPITEL 4X7.2 (GAUZE/BANDAGES/DRESSINGS) ×6 IMPLANT
DRSG VAC ATS LRG SENSATRAC (GAUZE/BANDAGES/DRESSINGS) ×3 IMPLANT
DRSG VERAFLO CLEANSE CC (GAUZE/BANDAGES/DRESSINGS) ×3
ELECT REM PT RETURN 9FT ADLT (ELECTROSURGICAL) ×3
ELECTRODE REM PT RTRN 9FT ADLT (ELECTROSURGICAL) ×1 IMPLANT
GAUZE SPONGE 4X4 12PLY STRL (GAUZE/BANDAGES/DRESSINGS) ×3 IMPLANT
GLOVE BIO SURGEON STRL SZ 6.5 (GLOVE) ×4 IMPLANT
GLOVE BIO SURGEON STRL SZ7.5 (GLOVE) ×18 IMPLANT
GLOVE BIO SURGEONS STRL SZ 6.5 (GLOVE) ×2
GLOVE BIOGEL PI IND STRL 7.5 (GLOVE) ×4 IMPLANT
GLOVE BIOGEL PI INDICATOR 7.5 (GLOVE) ×8
GOWN STRL REUS W/ TWL LRG LVL3 (GOWN DISPOSABLE) ×2 IMPLANT
GOWN STRL REUS W/TWL LRG LVL3 (GOWN DISPOSABLE) ×4
HANDPIECE INTERPULSE COAX TIP (DISPOSABLE) ×2
IV NS IRRIG 3000ML ARTHROMATIC (IV SOLUTION) ×12 IMPLANT
KIT BASIN OR (CUSTOM PROCEDURE TRAY) ×3 IMPLANT
KIT TURNOVER KIT B (KITS) ×3 IMPLANT
MANIFOLD NEPTUNE II (INSTRUMENTS) ×3 IMPLANT
NEEDLE 22X1 1/2 (OR ONLY) (NEEDLE) IMPLANT
NS IRRIG 1000ML POUR BTL (IV SOLUTION) ×6 IMPLANT
PACK GENERAL/GYN (CUSTOM PROCEDURE TRAY) ×3 IMPLANT
PACK ORTHO EXTREMITY (CUSTOM PROCEDURE TRAY) ×3 IMPLANT
PAD ARMBOARD 7.5X6 YLW CONV (MISCELLANEOUS) ×6 IMPLANT
ROD CARBON FIBER 500MM (Rod) ×6 IMPLANT
SCREW SCHNZ SD 5.0 60 THRD/150 (Screw) ×2 IMPLANT
SCREW SCHNZ SD 5.0 80 THRD/200 (Screw) ×2 IMPLANT
SCRW SCHANZ SD 5.0 60 THRD/150 (Screw) ×6 IMPLANT
SCRW SCHANZ SD 5.0 80 THRD/200 (Screw) ×6 IMPLANT
SET HNDPC FAN SPRY TIP SCT (DISPOSABLE) ×1 IMPLANT
SPONGE LAP 18X18 X RAY DECT (DISPOSABLE) ×3 IMPLANT
SUT ETHILON 2 0 PSLX (SUTURE) ×6 IMPLANT
SUT PDS AB 1 CTX 36 (SUTURE) ×3 IMPLANT
SUT PDS AB 2-0 CT1 27 (SUTURE) ×3 IMPLANT
TOWEL OR 17X24 6PK STRL BLUE (TOWEL DISPOSABLE) ×6 IMPLANT
TOWEL OR 17X26 10 PK STRL BLUE (TOWEL DISPOSABLE) ×6 IMPLANT
TUBE CONNECTING 12'X1/4 (SUCTIONS) ×1
TUBE CONNECTING 12X1/4 (SUCTIONS) ×2 IMPLANT
UNDERPAD 30X30 (UNDERPADS AND DIAPERS) ×3 IMPLANT

## 2017-12-26 NOTE — Progress Notes (Signed)
Inpatient Diabetes Program Recommendations  AACE/ADA: New Consensus Statement on Inpatient Glycemic Control (2015)  Target Ranges:  Prepandial:   less than 140 mg/dL      Peak postprandial:   less than 180 mg/dL (1-2 hours)      Critically ill patients:  140 - 180 mg/dL   No results found for: GLUCAP, HGBA1C  Review of Glycemic Control Results for Dorothy Wilson, Dorothy Wilson (MRN [id number]) as of 12/26/2017 13:42  Ref. Range 12/25/2017 19:27 12/25/2017 19:38 12/26/2017 01:14 12/26/2017 04:45  Glucose Latest Ref Range: 70 - 99 mg/dL 409294 (H) 811278 (H) 914169 (H) 228 (H)   Diabetes history: No history noted Outpatient Diabetes medications: None Inpatient Diabetes Program Recommendations:   Note elevated blood sugars.  If appropriate may consider ICU glycemic control order set- sensitive q 4 hours.    Thanks,  Beryl MeagerJenny Maleak Brazzel, RN, BC-ADM Inpatient Diabetes Coordinator Pager [phone] (8a-5p)
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
## 2017-12-26 NOTE — Transfer of Care (Signed)
Immediate Anesthesia Transfer of Care Note  Patient: Dorothy Wilson  Procedure(s) Performed: EXTERNAL FIXATION LEG (Right ) IRRIGATION AND DEBRIDEMENT EXTREMITY (Right )  Patient Location: ICU  Anesthesia Type:General  Level of Consciousness: sedated, patient cooperative and Patient remains intubated per anesthesia plan  Airway & Oxygen Therapy: Patient remains intubated per anesthesia plan and Patient placed on Ventilator (see vital sign flow sheet for setting)  Post-op Assessment: Report given to RN and Post -op Vital signs reviewed and stable  Post vital signs: Reviewed and stable  Last Vitals:  Vitals Value Taken Time  BP 115/65 12/26/2017  5:07 PM  Temp 36.8 C 12/26/2017  5:12 PM  Pulse 111 12/26/2017  5:11 PM  Resp 16 12/26/2017  5:12 PM  SpO2 98 % 12/26/2017  5:11 PM  Vitals shown include unvalidated device data.  Last Pain:  Vitals:   12/25/17 2230  TempSrc:   PainSc: 10-Worst pain ever         Complications: No apparent anesthesia complications

## 2017-12-26 NOTE — ED Notes (Signed)
Family updated as to patient's status via phone by Dr Lindie SpruceWyatt.  Dorothy PillowJennifer Wilson [phone], Dorothy Wilson [phone].

## 2017-12-26 NOTE — ED Notes (Signed)
O negative blood at bedside.

## 2017-12-26 NOTE — ED Notes (Signed)
Swelling to upper right forearm, open fracture right knee, bleeding active.  Road rash on abdomen, left knee and multiple small areas around body.  Bilateral hand swelling and multiple finger swelling.  Swelling to left foot.

## 2017-12-26 NOTE — Anesthesia Postprocedure Evaluation (Signed)
Anesthesia Post Note  Patient: Dorothy Wilson  Procedure(s) Performed: EXTERNAL FIXATION LEG (Right ) IRRIGATION AND DEBRIDEMENT EXTREMITY (Right )     Patient location during evaluation: SICU Anesthesia Type: General Level of consciousness: sedated Pain management: pain level controlled Vital Signs Assessment: post-procedure vital signs reviewed and stable Respiratory status: patient remains intubated per anesthesia plan Cardiovascular status: stable Postop Assessment: no apparent nausea or vomiting Anesthetic complications: no    Last Vitals:  Vitals:   12/26/17 1109 12/26/17 1200  BP: 112/68 (!) 98/59  Pulse: (!) 121 (!) 108  Resp: 19 16  Temp: (!) 38.3 C (!) 38.1 C  SpO2: 96% 100%    Last Pain:  Vitals:   12/25/17 2230  TempSrc:   PainSc: 10-Worst pain ever                 Dicky Boer,W. EDMOND
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
## 2017-12-26 NOTE — Anesthesia Preprocedure Evaluation (Addendum)
Anesthesia Evaluation  Patient identified by MRN, date of birth, ID band Patient awake    Reviewed: Allergy & Precautions, H&P , NPO status , Patient's Chart, lab work & pertinent test results, Unable to perform ROS - Chart review only  History of Anesthesia Complications Negative for: history of anesthetic complications  Airway Mallampati: Intubated       Dental no notable dental hx. (+) Teeth Intact, Dental Advisory Given   Pulmonary neg pulmonary ROS,    Pulmonary exam normal        Cardiovascular  Rhythm:Regular Rate:Normal     Neuro/Psych  Deafness  negative neurological ROS  negative psych ROS   GI/Hepatic negative GI ROS, Neg liver ROS,   Endo/Other  Morbid obesity  Renal/GU negative Renal ROS  negative genitourinary   Musculoskeletal negative musculoskeletal ROS (+)   Abdominal (+) + obese,   Peds  Hematology negative hematology ROS (+)   Anesthesia Other Findings Traumatic injuries 1. Multiple right rib fractures with tiny CT only PTX, pulmonary contusion, small hemothorax; 2.  Right hepatic lobe Grade II contusion and laceration with small amoount of perisplenic and perihepatic blood.  Some blood in the pelvis.  No active extravasation.  Positive FAST; 3.  Open, comminuted right tibial plateau fx with bleeding. 4.  Closed comminuted mid-shaft right humerus fracture; 5.  Possible bilateral hand fractures and possible left foot fracture; 6.  Arachnoid cyst left parietal lobe with some bleeding inside of cyst  Reproductive/Obstetrics negative OB ROS                             Anesthesia Physical  Anesthesia Plan  ASA: IV  Anesthesia Plan: General   Post-op Pain Management:    Induction: Inhalational  PONV Risk Score and Plan: 3 and Treatment may vary due to age or medical condition  Airway Management Planned: Oral ETT  Additional Equipment: None  Intra-op Plan:    Post-operative Plan: Post-operative intubation/ventilation  Informed Consent: I have reviewed the patients History and Physical, chart, labs and discussed the procedure including the risks, benefits and alternatives for the proposed anesthesia with the patient or authorized representative who has indicated his/her understanding and acceptance.   Dental advisory given  Plan Discussed with: CRNA and Anesthesiologist  Anesthesia Plan Comments:         Anesthesia Quick Evaluation  

## 2017-12-26 NOTE — Progress Notes (Signed)
Pt sister Rancy (sp?) called from out of town for update. She was able to inform nursing pt is able to read lips and respond back when at her baseline. She also stated pt mother, Imelda PillowJennifer Axel, will be arriving sometime late morning 12/26/17 from out of town. Pt boyfriend, Denyse AmassCorey, also may be coming but she is unsure. There is another sibling Tolani that is also from out of town.

## 2017-12-26 NOTE — ED Notes (Signed)
To CT with RN, EMT and Trauma Surgeon.

## 2017-12-26 NOTE — Progress Notes (Signed)
Patient ID: Dorothy Wilson, female   DOB: Jun 10, 1970, 47 y.o.   MRN: [id number] BP (!) 74/58 Comment: titrated up  Pulse 99   Temp 99 F (37.2 C)   Resp 16   Ht 5\' 6"  (1.676 m)   Wt 131.9 kg   LMP  (LMP Unknown)   SpO2 100%   BMI 46.93 kg/m  Alert, intubated Following commands Has a cystic region in the left frontal lobe. Possibly some blood in the cyst, no edema, no surrounding reaction in the brain.  Moving upper extremities No need to repeat head ct

## 2017-12-27 ENCOUNTER — Inpatient Hospital Stay (HOSPITAL_COMMUNITY): Payer: No Typology Code available for payment source

## 2017-12-27 ENCOUNTER — Encounter (HOSPITAL_COMMUNITY): Admission: EM | Disposition: A | Discharge status: Rehabilitation Facility | Payer: Self-pay | Source: Home / Self Care

## 2017-12-27 ENCOUNTER — Encounter (HOSPITAL_COMMUNITY): Payer: Self-pay | Admitting: Student

## 2017-12-27 HISTORY — PX: OPEN REDUCTION INTERNAL FIXATION (ORIF) METACARPAL: SHX6234

## 2017-12-27 HISTORY — PX: ORIF HUMERUS FRACTURE: SHX2126

## 2017-12-27 LAB — CBC
HCT: 22.5 % — ABNORMAL LOW (ref 36.0–46.0)
HCT: 31.3 % — ABNORMAL LOW (ref 36.0–46.0)
Hemoglobin: 7.3 g/dL — ABNORMAL LOW (ref 12.0–15.0)
Hemoglobin: 9.9 g/dL — ABNORMAL LOW (ref 12.0–15.0)
MCH: 29.7 pg (ref 26.0–34.0)
MCH: 28.8 pg (ref 26.0–34.0)
MCHC: 32.4 g/dL (ref 30.0–36.0)
MCHC: 31.6 g/dL (ref 30.0–36.0)
MCV: 91.5 fL (ref 78.0–100.0)
MCV: 91 fL (ref 78.0–100.0)
PLATELETS: 109 10*3/uL — AB (ref 150–400)
PLATELETS: 74 10*3/uL — AB (ref 150–400)
RBC: 2.46 MIL/uL — ABNORMAL LOW (ref 3.87–5.11)
RBC: 3.44 MIL/uL — ABNORMAL LOW (ref 3.87–5.11)
RDW: 15.1 % (ref 11.5–15.5)
RDW: 15.4 % (ref 11.5–15.5)
WBC: 9.8 10*3/uL (ref 4.0–10.5)
WBC: 8.1 10*3/uL (ref 4.0–10.5)

## 2017-12-27 LAB — GLUCOSE, CAPILLARY
GLUCOSE-CAPILLARY: 81 mg/dL (ref 70–99)
GLUCOSE-CAPILLARY: 111 mg/dL — AB (ref 70–99)
GLUCOSE-CAPILLARY: 74 mg/dL (ref 70–99)
GLUCOSE-CAPILLARY: 81 mg/dL (ref 70–99)
Glucose-Capillary: 100 mg/dL — ABNORMAL HIGH (ref 70–99)
Glucose-Capillary: 68 mg/dL — ABNORMAL LOW (ref 70–99)
Glucose-Capillary: 146 mg/dL — ABNORMAL HIGH (ref 70–99)
Glucose-Capillary: 33 mg/dL — CL (ref 70–99)
Glucose-Capillary: 109 mg/dL — ABNORMAL HIGH (ref 70–99)

## 2017-12-27 LAB — URINE CULTURE
Culture: NO GROWTH
Special Requests: NORMAL

## 2017-12-27 LAB — PREPARE RBC (CROSSMATCH)

## 2017-12-27 LAB — LACTIC ACID, PLASMA: Lactic Acid, Venous: 1.4 mmol/L (ref 0.5–1.9)

## 2017-12-27 SURGERY — OPEN REDUCTION INTERNAL FIXATION (ORIF) METACARPAL
Anesthesia: General | Site: Arm Upper | Laterality: Right

## 2017-12-27 MED ORDER — MIDAZOLAM HCL 5 MG/5ML IJ SOLN
INTRAMUSCULAR | Status: DC | PRN
  Administered 2017-12-27 (×2): 1 mg via INTRAVENOUS

## 2017-12-27 MED ORDER — FENTANYL CITRATE (PF) 250 MCG/5ML IJ SOLN
INTRAMUSCULAR | Status: AC
  Filled 2017-12-27: qty 5

## 2017-12-27 MED ORDER — 0.9 % SODIUM CHLORIDE (POUR BTL) OPTIME
TOPICAL | Status: DC | PRN
  Administered 2017-12-27 (×2): 1000 mL

## 2017-12-27 MED ORDER — CEFAZOLIN SODIUM 1 G IJ SOLR
INTRAMUSCULAR | Status: AC
  Filled 2017-12-27: qty 20

## 2017-12-27 MED ORDER — PHENYLEPHRINE HCL 10 MG/ML IJ SOLN
INTRAMUSCULAR | Status: AC
  Filled 2017-12-27: qty 2

## 2017-12-27 MED ORDER — ACETAMINOPHEN 325 MG PO TABS
650.0000 mg | ORAL_TABLET | Freq: Four times a day (QID) | ORAL | Status: DC | PRN
  Administered 2017-12-27 – 2017-12-29 (×4): 650 mg via ORAL
  Filled 2017-12-27 (×4): qty 2

## 2017-12-27 MED ORDER — DEXTROSE 50 % IV SOLN
INTRAVENOUS | Status: AC
  Filled 2017-12-27: qty 50

## 2017-12-27 MED ORDER — DEXTROSE 50 % IV SOLN
INTRAVENOUS | Status: DC | PRN
  Administered 2017-12-27: 12.5 g via INTRAVENOUS

## 2017-12-27 MED ORDER — PROPOFOL 10 MG/ML IV BOLUS
INTRAVENOUS | Status: AC
  Filled 2017-12-27: qty 20

## 2017-12-27 MED ORDER — ROCURONIUM BROMIDE 50 MG/5ML IV SOSY
PREFILLED_SYRINGE | INTRAVENOUS | Status: DC | PRN
  Administered 2017-12-27: 10 mg via INTRAVENOUS
  Administered 2017-12-27: 50 mg via INTRAVENOUS
  Administered 2017-12-27: 20 mg via INTRAVENOUS

## 2017-12-27 MED ORDER — VASOPRESSIN 20 UNIT/ML IV SOLN
INTRAVENOUS | Status: DC | PRN
  Administered 2017-12-27 (×3): 1 [IU] via INTRAVENOUS

## 2017-12-27 MED ORDER — SODIUM CHLORIDE 0.9% IV SOLUTION: Freq: Once | INTRAVENOUS | Status: DC

## 2017-12-27 MED ORDER — MIDAZOLAM HCL 2 MG/2ML IJ SOLN
INTRAMUSCULAR | Status: AC
  Filled 2017-12-27: qty 2

## 2017-12-27 MED ORDER — LACTATED RINGERS IV SOLN
INTRAVENOUS | Status: DC | PRN
  Administered 2017-12-27: 09:00:00 via INTRAVENOUS

## 2017-12-27 MED ORDER — VANCOMYCIN HCL 1000 MG IV SOLR
INTRAVENOUS | Status: DC | PRN
  Administered 2017-12-27: 1000 mg

## 2017-12-27 MED ORDER — CEFAZOLIN SODIUM-DEXTROSE 1-4 GM/50ML-% IV SOLN
1.0000 g | Freq: Once | INTRAVENOUS | Status: AC
  Administered 2018-01-01: 3 g via INTRAVENOUS
  Filled 2017-12-27: qty 50

## 2017-12-27 MED ORDER — SODIUM CHLORIDE 0.9 % IV SOLN
INTRAVENOUS | Status: DC | PRN
  Administered 2017-12-27: 100 ug/min via INTRAVENOUS

## 2017-12-27 MED ORDER — DEXAMETHASONE SODIUM PHOSPHATE 10 MG/ML IJ SOLN
INTRAMUSCULAR | Status: AC
  Filled 2017-12-27: qty 1

## 2017-12-27 MED ORDER — SODIUM CHLORIDE 0.9 % IV SOLN
10.0000 mL/h | Freq: Once | INTRAVENOUS | Status: AC
  Administered 2017-12-30: 10 mL/h via INTRAVENOUS

## 2017-12-27 MED ORDER — PROPOFOL 500 MG/50ML IV EMUL
INTRAVENOUS | Status: DC | PRN
  Administered 2017-12-27: 15 ug/kg/min via INTRAVENOUS

## 2017-12-27 MED ORDER — ROCURONIUM BROMIDE 50 MG/5ML IV SOSY
PREFILLED_SYRINGE | INTRAVENOUS | Status: AC
  Filled 2017-12-27: qty 15

## 2017-12-27 MED ORDER — SODIUM CHLORIDE 0.9 % IV SOLN
INTRAVENOUS | Status: DC | PRN
  Administered 2017-12-27: 10:00:00 via INTRAVENOUS

## 2017-12-27 MED ORDER — ONDANSETRON HCL 4 MG/2ML IJ SOLN
INTRAMUSCULAR | Status: AC
  Filled 2017-12-27: qty 2

## 2017-12-27 MED ORDER — DEXTROSE 5 % IV SOLN
INTRAVENOUS | Status: DC | PRN
  Administered 2017-12-27: 3 g via INTRAVENOUS

## 2017-12-27 MED ORDER — ALBUMIN HUMAN 5 % IV SOLN
INTRAVENOUS | Status: DC | PRN
  Administered 2017-12-27 (×2): via INTRAVENOUS

## 2017-12-27 MED ORDER — VANCOMYCIN HCL 1000 MG IV SOLR
INTRAVENOUS | Status: AC
  Filled 2017-12-27: qty 1000

## 2017-12-27 MED ORDER — ROCURONIUM BROMIDE 50 MG/5ML IV SOSY
PREFILLED_SYRINGE | INTRAVENOUS | Status: AC
  Filled 2017-12-27: qty 5

## 2017-12-27 MED ORDER — CALCIUM CHLORIDE 10 % IV SOLN
INTRAVENOUS | Status: DC | PRN
  Administered 2017-12-27 (×3): 250 mg via INTRAVENOUS

## 2017-12-27 SURGICAL SUPPLY — 113 items
BANDAGE ACE 3X5.8 VEL STRL LF (GAUZE/BANDAGES/DRESSINGS) ×5 IMPLANT
BANDAGE ACE 4X5 VEL STRL LF (GAUZE/BANDAGES/DRESSINGS) ×5 IMPLANT
BANDAGE ELASTIC 4 VELCRO ST LF (GAUZE/BANDAGES/DRESSINGS) ×5 IMPLANT
BANDAGE ELASTIC 6 VELCRO ST LF (GAUZE/BANDAGES/DRESSINGS) ×5 IMPLANT
BIT DRILL 2.5 X LONG (BIT) ×3
BIT DRILL CALIBRATED 1.8MM (BIT) ×3 IMPLANT
BIT DRILL PERC QC 2.8X200 100 (BIT) ×6 IMPLANT
BIT DRILL QC 2.4X100 (BIT) ×5 IMPLANT
BIT DRILL X LONG 2.5 (BIT) ×3 IMPLANT
BLADE CLIPPER SURG (BLADE) IMPLANT
BNDG COHESIVE 4X5 TAN STRL (GAUZE/BANDAGES/DRESSINGS) ×5 IMPLANT
BNDG ESMARK 4X9 LF (GAUZE/BANDAGES/DRESSINGS) ×5 IMPLANT
BNDG GAUZE ELAST 4 BULKY (GAUZE/BANDAGES/DRESSINGS) ×15 IMPLANT
BRUSH SCRUB SURG 4.25 DISP (MISCELLANEOUS) ×10 IMPLANT
CHLORAPREP W/TINT 26ML (MISCELLANEOUS) ×5 IMPLANT
CORDS BIPOLAR (ELECTRODE) ×5 IMPLANT
COVER SURGICAL LIGHT HANDLE (MISCELLANEOUS) ×5 IMPLANT
CUFF TOURNIQUET SINGLE 18IN (TOURNIQUET CUFF) ×5 IMPLANT
CUFF TOURNIQUET SINGLE 24IN (TOURNIQUET CUFF) IMPLANT
DERMABOND ADVANCED (GAUZE/BANDAGES/DRESSINGS) ×2
DERMABOND ADVANCED .7 DNX12 (GAUZE/BANDAGES/DRESSINGS) ×3 IMPLANT
DRAIN TLS ROUND 10FR (DRAIN) IMPLANT
DRAPE C-ARM 42X72 X-RAY (DRAPES) ×5 IMPLANT
DRAPE EXTREMITY T 121X128X90 (DRAPE) ×5 IMPLANT
DRAPE INCISE IOBAN 66X45 STRL (DRAPES) ×5 IMPLANT
DRAPE OEC MINIVIEW 54X84 (DRAPES) IMPLANT
DRAPE ORTHO SPLIT 77X108 STRL (DRAPES) ×4
DRAPE SURG 17X23 STRL (DRAPES) ×5 IMPLANT
DRAPE SURG ORHT 6 SPLT 77X108 (DRAPES) ×6 IMPLANT
DRAPE U-SHAPE 47X51 STRL (DRAPES) ×10 IMPLANT
DRILL BIT QUICK COUP 2.8MM 100 (BIT) ×4
DRILL BIT X LONG 2.5 (BIT) ×2
DRILL CALIBRATED 1.8MM (BIT) ×5
DRSG EMULSION OIL 3X3 NADH (GAUZE/BANDAGES/DRESSINGS) ×5 IMPLANT
DRSG MEPILEX BORDER 4X12 (GAUZE/BANDAGES/DRESSINGS) ×5 IMPLANT
DRSG MEPILEX BORDER 4X8 (GAUZE/BANDAGES/DRESSINGS) ×5 IMPLANT
DRSG PAD ABDOMINAL 8X10 ST (GAUZE/BANDAGES/DRESSINGS) ×5 IMPLANT
ELECT REM PT RETURN 9FT ADLT (ELECTROSURGICAL) ×5
ELECTRODE REM PT RTRN 9FT ADLT (ELECTROSURGICAL) ×3 IMPLANT
EVACUATOR 1/8 PVC DRAIN (DRAIN) IMPLANT
GAUZE SPONGE 4X4 12PLY STRL (GAUZE/BANDAGES/DRESSINGS) ×5 IMPLANT
GAUZE XEROFORM 1X8 LF (GAUZE/BANDAGES/DRESSINGS) ×5 IMPLANT
GLOVE BIO SURGEON STRL SZ7.5 (GLOVE) ×20 IMPLANT
GLOVE BIOGEL M 8.0 STRL (GLOVE) ×5 IMPLANT
GLOVE BIOGEL PI IND STRL 7.5 (GLOVE) ×3 IMPLANT
GLOVE BIOGEL PI INDICATOR 7.5 (GLOVE) ×2
GLOVE SS BIOGEL STRL SZ 8 (GLOVE) ×3 IMPLANT
GLOVE SUPERSENSE BIOGEL SZ 8 (GLOVE) ×2
GOWN STRL REUS W/ TWL LRG LVL3 (GOWN DISPOSABLE) ×9 IMPLANT
GOWN STRL REUS W/ TWL XL LVL3 (GOWN DISPOSABLE) ×9 IMPLANT
GOWN STRL REUS W/TWL LRG LVL3 (GOWN DISPOSABLE) ×6
GOWN STRL REUS W/TWL XL LVL3 (GOWN DISPOSABLE) ×6
KIT BASIN OR (CUSTOM PROCEDURE TRAY) ×5 IMPLANT
KIT TURNOVER KIT B (KITS) ×5 IMPLANT
MANIFOLD NEPTUNE II (INSTRUMENTS) ×5 IMPLANT
NEEDLE 22X1 1/2 (OR ONLY) (NEEDLE) IMPLANT
NEEDLE HYPO 25X1 1.5 SAFETY (NEEDLE) ×5 IMPLANT
NS IRRIG 1000ML POUR BTL (IV SOLUTION) ×5 IMPLANT
PACK ORTHO EXTREMITY (CUSTOM PROCEDURE TRAY) ×5 IMPLANT
PAD ARMBOARD 7.5X6 YLW CONV (MISCELLANEOUS) ×5 IMPLANT
PAD CAST 3X4 CTTN HI CHSV (CAST SUPPLIES) ×3 IMPLANT
PAD CAST 4YDX4 CTTN HI CHSV (CAST SUPPLIES) ×3 IMPLANT
PADDING CAST COTTON 3X4 STRL (CAST SUPPLIES) ×2
PADDING CAST COTTON 4X4 STRL (CAST SUPPLIES) ×2
PLATE Y VA 1.5 3H HD-7H SHAFT (Plate) ×10 IMPLANT
PROS LCP PLATE 12 163M (Plate) ×5 IMPLANT
PROSTHESIS LCP PLATE 12 163M (Plate) ×3 IMPLANT
SCREW CORTEX 2.4X22MM (Screw) ×5 IMPLANT
SCREW CORTEX 3.5 20MM (Screw) ×2 IMPLANT
SCREW CORTEX 3.5 24MM (Screw) ×2 IMPLANT
SCREW CORTEX 3.5 26MM (Screw) ×2 IMPLANT
SCREW CORTEX 3.5 28MM (Screw) ×2 IMPLANT
SCREW LCP STARDRIVE ST 1.5X111 (Screw) ×30 IMPLANT
SCREW LCP STARDRIVE ST 1.5X116 (Screw) ×5 IMPLANT
SCREW LCP STARDRIVE ST 1.5X118 (Screw) ×5 IMPLANT
SCREW LCP STARDRIVE ST 1.5X12 (Screw) ×15 IMPLANT
SCREW LOCK CORT ST 3.5X20 (Screw) ×3 IMPLANT
SCREW LOCK CORT ST 3.5X24 (Screw) ×3 IMPLANT
SCREW LOCK CORT ST 3.5X26 (Screw) ×3 IMPLANT
SCREW LOCK CORT ST 3.5X28 (Screw) ×3 IMPLANT
SCREW LOCK T15 FT 28X3.5X2.9X (Screw) ×6 IMPLANT
SCREW LOCKING 3.5X26 (Screw) ×10 IMPLANT
SCREW LOCKING 3.5X28 (Screw) ×4 IMPLANT
SCREW T4 STARDRIVE ST 1.5X16 (Screw) ×10 IMPLANT
SCRUB BETADINE 4OZ XXX (MISCELLANEOUS) ×5 IMPLANT
SOL PREP POV-IOD 4OZ 10% (MISCELLANEOUS) ×5 IMPLANT
SPONGE LAP 18X18 X RAY DECT (DISPOSABLE) ×5 IMPLANT
SPONGE LAP 4X18 RFD (DISPOSABLE) IMPLANT
STAPLER VISISTAT 35W (STAPLE) ×5 IMPLANT
SUCTION FRAZIER HANDLE 10FR (MISCELLANEOUS) ×2
SUCTION TUBE FRAZIER 10FR DISP (MISCELLANEOUS) ×3 IMPLANT
SUT ETHILON 3 0 PS 1 (SUTURE) ×15 IMPLANT
SUT MNCRL AB 3-0 PS2 18 (SUTURE) ×5 IMPLANT
SUT MNCRL AB 4-0 PS2 18 (SUTURE) ×5 IMPLANT
SUT PROLENE 0 CT (SUTURE) IMPLANT
SUT PROLENE 3 0 PS 2 (SUTURE) IMPLANT
SUT VIC AB 0 CT1 27 (SUTURE) ×6
SUT VIC AB 0 CT1 27XBRD ANBCTR (SUTURE) ×9 IMPLANT
SUT VIC AB 1 CT1 27 (SUTURE) ×2
SUT VIC AB 1 CT1 27XBRD ANBCTR (SUTURE) ×3 IMPLANT
SUT VIC AB 2-0 CT1 27 (SUTURE) ×6
SUT VIC AB 2-0 CT1 TAPERPNT 27 (SUTURE) ×9 IMPLANT
SUT VIC AB 3-0 FS2 27 (SUTURE) IMPLANT
SYR CONTROL 10ML LL (SYRINGE) ×5 IMPLANT
SYSTEM CHEST DRAIN TLS 7FR (DRAIN) IMPLANT
TOWEL OR 17X24 6PK STRL BLUE (TOWEL DISPOSABLE) ×5 IMPLANT
TOWEL OR 17X26 10 PK STRL BLUE (TOWEL DISPOSABLE) ×15 IMPLANT
TRAY FOLEY MTR SLVR 16FR STAT (SET/KITS/TRAYS/PACK) IMPLANT
TUBE CONNECTING 12'X1/4 (SUCTIONS) ×1
TUBE CONNECTING 12X1/4 (SUCTIONS) ×4 IMPLANT
TUBE EVACUATION TLS (MISCELLANEOUS) ×5 IMPLANT
WATER STERILE IRR 1000ML POUR (IV SOLUTION) ×5 IMPLANT
YANKAUER SUCT BULB TIP NO VENT (SUCTIONS) IMPLANT

## 2017-12-27 NOTE — Care Management Note (Addendum)
Case Management Note  Patient Details  Name: Dorothy HueMarilyn Lezlie Mincey MRN: [id number] Date of Birth: [birth year]  Subjective/Objective: Pt admitted on 12/25/17 s/p multiple Rt rib fx with small PTX, pulmonary contusion, small hemothorax, Rt hepatic lobe Grade II contusion and laceration, open comminuted Rt tibial plateau fx with bleeding, closed comminuted mid-shaft Rt humerus fx, possible bilateral hand fx, and Lt foot fx, arachnoid cyst Lt parietal lobe with some bleeding in cyst, Lt metacarpal fx, and Rt phalangeal fx.  PTA, pt independent; [place] works in [place].  [place] was in [place] for a conference.  She works for [employer] for [employer].                       Action/Plan: Worker's Public house managerCompensation Case Managers on floor today.  Spoke with Verneda SkillKathryn Alexander, RN, CRP, medical case manager (cell [phone], fax: [phone]) and Dewitt RotaStephanie Hinkle, RN, BSN (cell [phone], fax [phone]) with [employer].  Samara DeistKathryn assigned to case, but will be off next week; Judeth CornfieldStephanie will cover in her absence.  Provided Case Manager with printed clinical information and also faxed to her, at her request.  RN Case Manager states ultimate goal will be to get pt stabilized and then [facility], location to be determined by worker's comp.  Will provide updates to case managers every couple of days by fax.  Provided them with my contact information as well.  Will follow progression.    Case manager did relay that family is concerned about interpreting being used for pt.  They prefer live sign language interpreters over Stratus video interpreter if at all possible, as they feel pt having difficulty seeing Str[employer].    Expected Discharge Date:                  Expected Discharge Plan:  IP Rehab Facility  In-House Referral:  Clinical Social Work  Discharge planning Services  CM Consult(Worker's Comp)  Post Acute Care Choice:    Choice offered to:      DME Arranged:    DME Agency:     HH Arranged:    HH Agency:     Status of Service:  In process, will continue to follow  If discussed at Long Length of Stay Meetings, dates discussed:    Additional Comments:  Quintella BatonJulie W. Daniqua Campoy, RN, BSN  Trauma/Neuro ICU Case Manager [phone]

## 2017-12-27 NOTE — Anesthesia Preprocedure Evaluation (Signed)
Anesthesia Evaluation  Patient identified by MRN, date of birth, ID band Patient awake    Reviewed: Allergy & Precautions, H&P , NPO status , Patient's Chart, lab work & pertinent test results, Unable to perform ROS - Chart review only  History of Anesthesia Complications Negative for: history of anesthetic complications  Airway Mallampati: Intubated       Dental no notable dental hx. (+) Teeth Intact, Dental Advisory Given   Pulmonary neg pulmonary ROS,    Pulmonary exam normal        Cardiovascular  Rhythm:Regular Rate:Normal     Neuro/Psych  Deafness  negative neurological ROS  negative psych ROS   GI/Hepatic negative GI ROS, Neg liver ROS,   Endo/Other  Morbid obesity  Renal/GU negative Renal ROS  negative genitourinary   Musculoskeletal negative musculoskeletal ROS (+)   Abdominal (+) + obese,   Peds  Hematology negative hematology ROS (+)   Anesthesia Other Findings Traumatic injuries 1. Multiple right rib fractures with tiny CT only PTX, pulmonary contusion, small hemothorax; 2.  Right hepatic lobe Grade II contusion and laceration with small amoount of perisplenic and perihepatic blood.  Some blood in the pelvis.  No active extravasation.  Positive FAST; 3.  Open, comminuted right tibial plateau fx with bleeding. 4.  Closed comminuted mid-shaft right humerus fracture; 5.  Possible bilateral hand fractures and possible left foot fracture; 6.  Arachnoid cyst left parietal lobe with some bleeding inside of cyst  Reproductive/Obstetrics negative OB ROS                             Anesthesia Physical  Anesthesia Plan  ASA: IV  Anesthesia Plan: General   Post-op Pain Management:    Induction: Inhalational  PONV Risk Score and Plan: 3 and Treatment may vary due to age or medical condition  Airway Management Planned: Oral ETT  Additional Equipment: None  Intra-op Plan:    Post-operative Plan: Post-operative intubation/ventilation  Informed Consent: I have reviewed the patients History and Physical, chart, labs and discussed the procedure including the risks, benefits and alternatives for the proposed anesthesia with the patient or authorized representative who has indicated his/her understanding and acceptance.   Dental advisory given  Plan Discussed with: CRNA and Anesthesiologist  Anesthesia Plan Comments:         Anesthesia Quick Evaluation

## 2017-12-27 NOTE — Progress Notes (Signed)
Patient ID: Dorothy Wilson, female   DOB: Aug 28, 1970, 47 y.o.   MRN: [id number] Follow up - Trauma Critical Care  Patient Details:    Dorothy Wilson is an 47 y.o. female.  Lines/tubes : Airway 7.5 mm (Active)  Secured at (cm) 24 cm 12/27/2017  3:32 AM  Measured From Lips 12/27/2017  3:32 AM  Secured Location Left 12/27/2017  3:32 AM  Secured By [employer] 12/27/2017  3:32 AM  Tube Holder Repositioned Yes 12/27/2017  3:32 AM  Cuff Pressure (cm H2O) 28 cm H2O 12/26/2017 11:09 AM  Site Condition Dry 12/27/2017  3:32 AM     CVC Triple Lumen 12/25/17 Left Femoral (Active)  Indication for Insertion or Continuance of Line Vasoactive infusions;Prolonged intravenous therapies 12/26/2017  8:00 PM  Site Assessment Clean;Dry;Intact 12/26/2017  8:00 PM  Proximal Lumen Status Infusing 12/26/2017  8:00 PM  Medial Lumen Status In-line blood sampling system in place 12/26/2017  8:00 PM  Distal Lumen Status Infusing 12/26/2017  8:00 PM  Dressing Type Transparent;Occlusive 12/26/2017  8:00 PM  Dressing Status Clean;Dry;Intact;Antimicrobial disc in place 12/26/2017  8:00 PM  Line Care Connections checked and tightened 12/26/2017  8:00 PM  Dressing Intervention New dressing;Dressing changed;Antimicrobial disc changed 12/27/2017  1:00 AM  Dressing Change Due 01/03/18 12/27/2017  1:00 AM     Negative Pressure Wound Therapy Knee Anterior;Right (Active)  Site / Wound Assessment Dressing in place / Unable to assess 12/26/2017  8:00 PM  Target Pressure (mmHg) 125 12/26/2017  8:00 PM  Canister Changed Yes 12/27/2017  6:00 AM  Dressing Status Intact 12/26/2017  8:00 PM  Drainage Amount Moderate 12/26/2017  8:00 PM  Drainage Description Sanguineous 12/26/2017  8:00 PM  Output (mL) 500 mL 12/27/2017  6:00 AM     NG/OG Tube Orogastric 18 Fr. Center mouth Xray (Active)  Site Assessment Clean;Dry;Intact 12/26/2017  8:00 PM  Ongoing Placement Verification No change in cm markings or external length of tube  from initial placement;No change in respiratory status;No acute changes, not attributed to clinical condition 12/26/2017  8:00 PM  Status Suction-low intermittent 12/26/2017  8:00 PM  Amount of suction 112 mmHg 12/26/2017  8:00 PM  Drainage Appearance Bile 12/26/2017  8:00 AM  Output (mL) 100 mL 12/27/2017  6:00 AM     Urethral Catheter Pennie Banter, RN  Latex;Straight-tip;Temperature probe 14 Fr. (Active)  Indication for Insertion or Continuance of Catheter Peri-operative use for selective surgical procedure 12/26/2017  8:00 PM  Site Assessment Clean;Intact 12/26/2017  8:00 PM  Catheter Maintenance Bag below level of bladder;Catheter secured;Drainage bag/tubing not touching floor;Insertion date on drainage bag;No dependent loops;Seal intact;Bag emptied prior to transport 12/26/2017  8:00 PM  Collection Container Standard drainage bag 12/26/2017  8:00 PM  Securement Method Securing device (Describe) 12/26/2017  8:00 PM  Urinary Catheter Interventions Unclamped 12/26/2017  8:00 AM  Output (mL) 150 mL 12/27/2017  8:00 AM    Microbiology/Sepsis markers: Results for orders placed or performed during the hospital encounter of 12/25/17  Urine culture     Status: None   Collection Time: 12/25/17 10:06 PM  Result Value Ref Range Status   Specimen Description URINE, CATHETERIZED  Final   Special Requests Normal  Final   Culture   Final    NO GROWTH Performed at [facility], [address], [place], [place] [address]    Report Status 12/27/2017 FINAL  Final  MRSA PCR Screening     Status: None   Collection Time: 12/25/17  11:37 PM  Result Value Ref Range Status   MRSA by PCR NEGATIVE NEGATIVE Final    Comment:        The GeneXpert MRSA Assay (FDA approved for NASAL specimens only), is one component of a comprehensive MRSA colonization surveillance program. It is not intended to diagnose MRSA infection nor to guide or monitor treatment for MRSA infections. Performed at [facility], [address] [place], [place] [address]   Surgical PCR screen     Status: None   Collection Time: 12/26/17 10:56 AM  Result Value Ref Range Status   MRSA, PCR NEGATIVE NEGATIVE Final   Staphylococcus aureus NEGATIVE NEGATIVE Final    Comment: (NOTE) The Xpert SA Assay (FDA approved for NASAL specimens in patients 49 years of age and older), is one component of a comprehensive surveillance program. It is not intended to diagnose infection nor to guide or monitor treatment. Performed at [facility], [address] [place], [place] [address]     Anti-infectives:  Anti-infectives (From admission, onward)   Start     Dose/Rate Route Frequency Ordered Stop   12/26/17 2000  cefTRIAXone (ROCEPHIN) 2 g in sodium chloride 0.9 % 100 mL IVPB     2 g 200 mL/hr over 30 Minutes Intravenous Every 24 hours 12/26/17 1857     12/26/17 1540  vancomycin (VANCOCIN) powder  Status:  Discontinued       As needed 12/26/17 1542 12/26/17 1656   12/26/17 1539  tobramycin (NEBCIN) powder  Status:  Discontinued       As needed 12/26/17 1539 12/26/17 1656   12/26/17 1200  ceFAZolin (ANCEF) IVPB 2g/100 mL premix  Status:  Discontinued     2 g 200 mL/hr over 30 Minutes Intravenous Every 8 hours 12/26/17 0640 12/26/17 1857   12/26/17 0400  ceFAZolin (ANCEF) IVPB 1 g/50 mL premix  Status:  Discontinued     1 g 100 mL/hr over 30 Minutes Intravenous Every 8 hours 12/25/17 2140 12/26/17 0640   12/25/17 1907  ceFAZolin (ANCEF) 2-4 GM/100ML-% IVPB    Note to Pharmacy:  Lysle Pearlumbarger, Rachel   : cabinet override      12/25/17 1907 12/25/17 2030       Consults: Treatment Team:  Jimmye NormanWyatt, James, MD Yolonda Kidaogers, Jason Patrick, MD Haddix, Gillie MannersKevin P, MD Coletta Memosabbell, Kyle, MD   Subjective:    Overnight Issues:   Objective:  Vital signs for last 24 hours: Temp:  [98.2 F (36.8 C)-100.9 F (38.3 C)] 99.7 F (37.6 C) (08/16 0815) Pulse Rate:  [92-126] 107 (08/16 0815) Resp:  [14-24] 16 (08/16 0815) BP:  (51-129)/(28-93) 115/45 (08/16 0815) SpO2:  [96 %-100 %] 100 % (08/16 0815) FiO2 (%):  [40 %] 40 % (08/16 0400)  Hemodynamic parameters for last 24 hours:    Intake/Output from previous day: 08/15 0701 - 08/16 0700 In: 4292.5 [I.V.:3548.4; IV Piggyback:744.1] Out: 2765 [Urine:1965; Emesis/NG output:100; Drains:500; Blood:200]  Intake/Output this shift: Total I/O In: 417.8 [I.V.:417.8] Out: 150 [Urine:150]  Vent settings for last 24 hours: Vent Mode: PRVC FiO2 (%):  [40 %] 40 % Set Rate:  [16 bmp] 16 bmp Vt Set:  [550 mL] 550 mL PEEP:  [5 cmH20] 5 cmH20 Pressure Support:  [10 cmH20] 10 cmH20 Plateau Pressure:  [18 cmH20-23 cmH20] 18 cmH20  Physical Exam:  General: on vent Neuro: on vent, aroused and moved LLE HEENT/Neck: ETT and collar Resp: clear to auscultation bilaterally CVS: regular rate and rhythm, S1, S2 normal,  no murmur, click, rub or gallop GI: soft, NT, +BS Extremities: ex fix RLE, L knee abr, R hum splint  Results for orders placed or performed during the hospital encounter of 12/25/17 (from the past 24 hour(s))  Provider-confirm verbal Blood Bank order - RBC, FFP, Type & Screen; 2 Units; Order taken: 12/25/2017; 6:58 PM; Level 1 Trauma, Emergency Release, STAT 2 units of O negative red cells and 2 units of A plasmas emergency released to the ER @ 1902. All...     Status: None   Collection Time: 12/26/17  9:08 AM  Result Value Ref Range   Blood product order confirm      MD AUTHORIZATION REQUESTED Performed at [facility], [address] [place], [place] [address]   Lactic acid, plasma     Status: Abnormal   Collection Time: 12/26/17 10:00 AM  Result Value Ref Range   Lactic Acid, Venous 2.3 (HH) 0.5 - 1.9 mmol/L  Surgical PCR screen     Status: None   Collection Time: 12/26/17 10:56 AM  Result Value Ref Range   MRSA, PCR NEGATIVE NEGATIVE   Staphylococcus aureus NEGATIVE NEGATIVE  CBC     Status: Abnormal   Collection Time: 12/26/17 11:00 AM   Result Value Ref Range   WBC 12.4 (H) 4.0 - 10.5 K/uL   RBC 3.72 (L) 3.87 - 5.11 MIL/uL   Hemoglobin 10.9 (L) 12.0 - 15.0 g/dL   HCT 60.432.8 (L) 54.036.0 - 98.146.0 %   MCV 88.2 78.0 - 100.0 fL   MCH 29.3 26.0 - 34.0 pg   MCHC 33.2 30.0 - 36.0 g/dL   RDW 19.114.6 47.811.5 - 29.515.5 %   Platelets 152 150 - 400 K/uL  CBC     Status: Abnormal   Collection Time: 12/26/17  5:51 PM  Result Value Ref Range   WBC 8.5 4.0 - 10.5 K/uL   RBC 2.74 (L) 3.87 - 5.11 MIL/uL   Hemoglobin 8.0 (L) 12.0 - 15.0 g/dL   HCT 62.124.7 (L) 30.836.0 - 65.746.0 %   MCV 90.1 78.0 - 100.0 fL   MCH 29.2 26.0 - 34.0 pg   MCHC 32.4 30.0 - 36.0 g/dL   RDW 84.615.0 96.211.5 - 95.215.5 %   Platelets  150 - 400 K/uL    PLATELET CLUMPS NOTED ON SMEAR, COUNT APPEARS DECREASED  Glucose, capillary     Status: Abnormal   Collection Time: 12/26/17  8:29 PM  Result Value Ref Range   Glucose-Capillary 125 (H) 70 - 99 mg/dL  Glucose, capillary     Status: Abnormal   Collection Time: 12/27/17  1:31 AM  Result Value Ref Range   Glucose-Capillary 100 (H) 70 - 99 mg/dL  Glucose, capillary     Status: None   Collection Time: 12/27/17  4:11 AM  Result Value Ref Range   Glucose-Capillary 81 70 - 99 mg/dL  CBC     Status: Abnormal   Collection Time: 12/27/17  5:00 AM  Result Value Ref Range   WBC 9.8 4.0 - 10.5 K/uL   RBC 2.46 (L) 3.87 - 5.11 MIL/uL   Hemoglobin 7.3 (L) 12.0 - 15.0 g/dL   HCT 84.122.5 (L) 32.436.0 - 40.146.0 %   MCV 91.5 78.0 - 100.0 fL   MCH 29.7 26.0 - 34.0 pg   MCHC 32.4 30.0 - 36.0 g/dL   RDW 02.715.1 25.311.5 - 66.415.5 %   Platelets 109 (L) 150 - 400 K/uL  Lactic acid, plasma     Status:  None   Collection Time: 12/27/17  5:00 AM  Result Value Ref Range   Lactic Acid, Venous 1.4 0.5 - 1.9 mmol/L  Prepare RBC     Status: None   Collection Time: 12/27/17  8:27 AM  Result Value Ref Range   Order Confirmation      ORDER PROCESSED BY BLOOD BANK Performed at [facility], [address], [place], [place] [address]   Glucose, capillary     Status: Abnormal    Collection Time: 12/27/17  8:35 AM  Result Value Ref Range   Glucose-Capillary 68 (L) 70 - 99 mg/dL  Prepare RBC     Status: None   Collection Time: 12/27/17  8:51 AM  Result Value Ref Range   Order Confirmation      ORDER PROCESSED BY BLOOD BANK Performed at [facility], [address], [place], [place] [address]     Assessment & Plan: Present on Admission: . Liver laceration, closed, initial encounter    LOS: 2 days   Additional comments:I reviewed the patient's new clinical lab test results. Marland Kitchen PHBC Acute hypoxic ventilator dependent respiratory failure - full support going to OR, good gas exchange Mult R rib FX with HPTX/pulm contusion - no PTX on CXR today Hemorrhagic shock - has responded well to resuscitation, Hb and INR corrected, Lactate improved Grade 4 liver lac - no extrav, bedrest for 72h TBI/SDH adjacent to L frontal arachnoid cyst - per Dr. Franky Macho ABL anemia - TF 2u for OR AKI Open R tib fib - washed out in ED. To OR today with Dr. Jena Gauss for ex fix R humerus FX - per Dr. Jena Gauss R second distal phalanx FX - per Dr. Jena Gauss L 2-4 MC FXs - per Dr. Jena Gauss L L2-3 TVP FXs FEN - no TF yet, lytes OK VTE - no Lovenox yet Dispo - ICU I spoke with her mother and BF here from [facility] and updated them on her condition and the plan of care. Critical Care Total Time*: 42 Minutes  Violeta Gelinas, MD, MPH, [facility] Trauma: [phone] General Surgery: [phone]  12/27/2017  *Care during the described time interval was provided by me. I have reviewed this patient's available data, including medical history, events of note, physical examination and test results as part of my evaluation.

## 2017-12-27 NOTE — Progress Notes (Signed)
Hypoglycemic Event  CBG: 68  Treatment: pt being transferred to OR, no treatment prior to departure  Symptoms: None  Follow-up CBG: Time: CBG Result:  Possible Reasons for Event: Inadequate meal intake  Comments/MD notified:CRNA notified in OR prior to surgery    Felton ClintonWhite Conleigh Heinlein, Asher MuirJamie P

## 2017-12-27 NOTE — Anesthesia Postprocedure Evaluation (Signed)
Anesthesia Post Note  Patient: Dorothy Wilson  Procedure(s) Performed: OPEN REDUCTION INTERNAL FIXATION (ORIF) METACARPAL (Left ) OPEN REDUCTION INTERNAL FIXATION (ORIF) HUMERAL SHAFT FRACTURE (Right Arm Upper)     Patient location during evaluation: PACU Anesthesia Type: General Level of consciousness: awake and alert Pain management: pain level controlled Vital Signs Assessment: post-procedure vital signs reviewed and stable Respiratory status: spontaneous breathing, nonlabored ventilation, respiratory function stable and patient connected to nasal cannula oxygen Cardiovascular status: blood pressure returned to baseline and stable Postop Assessment: no apparent nausea or vomiting Anesthetic complications: no    Last Vitals:  Vitals:   12/27/17 1225 12/27/17 1228  BP: (!) 116/49 (!) 116/49  Pulse: 75 77  Resp: 16 16  Temp:  (!) 36.4 C  SpO2: 98% 98%    Last Pain:  Vitals:   12/27/17 1228  TempSrc: Oral  PainSc:                  Lowella CurbWarren Ray Adesuwa Osgood

## 2017-12-27 NOTE — Transfer of Care (Signed)
Immediate Anesthesia Transfer of Care Note  Patient: Dorothy Wilson  Procedure(s) Performed: OPEN REDUCTION INTERNAL FIXATION (ORIF) METACARPAL (Left ) OPEN REDUCTION INTERNAL FIXATION (ORIF) HUMERAL SHAFT FRACTURE (Right Arm Upper)  Patient Location: NICU  Anesthesia Type:General  Level of Consciousness: sedated and Patient remains intubated per anesthesia plan  Airway & Oxygen Therapy: Patient remains intubated per anesthesia plan and Patient placed on Ventilator (see vital sign flow sheet for setting)  Post-op Assessment: Report given to RN and Post -op Vital signs reviewed and stable  Post vital signs: Reviewed and stable  Last Vitals:  Vitals Value Taken Time  BP 116/49 12/27/2017 12:28 PM  Temp 36.3 C 12/27/2017 12:33 PM  Pulse 77 12/27/2017 12:33 PM  Resp 16 12/27/2017 12:33 PM  SpO2 99 % 12/27/2017 12:33 PM  Vitals shown include unvalidated device data.  Last Pain:  Vitals:   12/27/17 0400  TempSrc: Bladder  PainSc:          Complications: No apparent anesthesia complications
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
## 2017-12-28 ENCOUNTER — Inpatient Hospital Stay (HOSPITAL_COMMUNITY): Payer: No Typology Code available for payment source

## 2017-12-28 LAB — CBC
HCT: 30.1 % — ABNORMAL LOW (ref 36.0–46.0)
HCT: 27.9 % — ABNORMAL LOW (ref 36.0–46.0)
Hemoglobin: 9.5 g/dL — ABNORMAL LOW (ref 12.0–15.0)
Hemoglobin: 9 g/dL — ABNORMAL LOW (ref 12.0–15.0)
MCH: 28.9 pg (ref 26.0–34.0)
MCH: 29.2 pg (ref 26.0–34.0)
MCHC: 31.6 g/dL (ref 30.0–36.0)
MCHC: 32.3 g/dL (ref 30.0–36.0)
MCV: 91.5 fL (ref 78.0–100.0)
MCV: 90.6 fL (ref 78.0–100.0)
PLATELETS: 85 10*3/uL — AB (ref 150–400)
PLATELETS: 65 10*3/uL — AB (ref 150–400)
RBC: 3.29 MIL/uL — ABNORMAL LOW (ref 3.87–5.11)
RBC: 3.08 MIL/uL — ABNORMAL LOW (ref 3.87–5.11)
RDW: 15.5 % (ref 11.5–15.5)
RDW: 15.1 % (ref 11.5–15.5)
WBC: 8.8 10*3/uL (ref 4.0–10.5)
WBC: 8.2 10*3/uL (ref 4.0–10.5)

## 2017-12-28 LAB — POCT I-STAT EG7
ACID-BASE DEFICIT: 5 mmol/L — AB (ref 0.0–2.0)
Acid-base deficit: 4 mmol/L — ABNORMAL HIGH (ref 0.0–2.0)
BICARBONATE: 23 mmol/L (ref 20.0–28.0)
Bicarbonate: 21.7 mmol/L (ref 20.0–28.0)
CALCIUM ION: 1.1 mmol/L — AB (ref 1.15–1.40)
Calcium, Ion: 1.25 mmol/L (ref 1.15–1.40)
HCT: 20 % — ABNORMAL LOW (ref 36.0–46.0)
HEMATOCRIT: 29 % — AB (ref 36.0–46.0)
HEMOGLOBIN: 6.8 g/dL — AB (ref 12.0–15.0)
Hemoglobin: 9.9 g/dL — ABNORMAL LOW (ref 12.0–15.0)
O2 Saturation: 53 %
O2 Saturation: 69 %
PH VEN: 7.298 (ref 7.250–7.430)
PO2 VEN: 32 mmHg (ref 32.0–45.0)
POTASSIUM: 4.7 mmol/L (ref 3.5–5.1)
Potassium: 4.7 mmol/L (ref 3.5–5.1)
SODIUM: 147 mmol/L — AB (ref 135–145)
Sodium: 147 mmol/L — ABNORMAL HIGH (ref 135–145)
TCO2: 25 mmol/L (ref 22–32)
TCO2: 23 mmol/L (ref 22–32)
pCO2, Ven: 51.3 mmHg (ref 44.0–60.0)
pCO2, Ven: 44.3 mmHg (ref 44.0–60.0)
pH, Ven: 7.26 (ref 7.250–7.430)
pO2, Ven: 40 mmHg (ref 32.0–45.0)

## 2017-12-28 LAB — GLUCOSE, CAPILLARY
GLUCOSE-CAPILLARY: 114 mg/dL — AB (ref 70–99)
GLUCOSE-CAPILLARY: 92 mg/dL (ref 70–99)
GLUCOSE-CAPILLARY: 97 mg/dL (ref 70–99)
Glucose-Capillary: 115 mg/dL — ABNORMAL HIGH (ref 70–99)
Glucose-Capillary: 125 mg/dL — ABNORMAL HIGH (ref 70–99)
Glucose-Capillary: 90 mg/dL (ref 70–99)

## 2017-12-28 LAB — BASIC METABOLIC PANEL
Anion gap: 4 — ABNORMAL LOW (ref 5–15)
BUN: 8 mg/dL (ref 6–20)
CALCIUM: 7.7 mg/dL — AB (ref 8.9–10.3)
CO2: 26 mmol/L (ref 22–32)
CREATININE: 0.9 mg/dL (ref 0.44–1.00)
Chloride: 115 mmol/L — ABNORMAL HIGH (ref 98–111)
GFR calc non Af Amer: >60 mL/min (ref >60)
Glucose, Bld: 102 mg/dL — ABNORMAL HIGH (ref 70–99)
Potassium: 4.7 mmol/L (ref 3.5–5.1)
SODIUM: 145 mmol/L (ref 135–145)

## 2017-12-28 LAB — PHOSPHORUS
PHOSPHORUS: 2.4 mg/dL — AB (ref 2.5–4.6)
Phosphorus: 2.5 mg/dL (ref 2.5–4.6)

## 2017-12-28 LAB — POCT I-STAT 3, ART BLOOD GAS (G3+)
ACID-BASE DEFICIT: 3 mmol/L — AB (ref 0.0–2.0)
Bicarbonate: 23.9 mmol/L (ref 20.0–28.0)
O2 SAT: 94 %
Patient temperature: 37.7
TCO2: 26 mmol/L (ref 22–32)
pCO2 arterial: 56.7 mmHg — ABNORMAL HIGH (ref 32.0–48.0)
pH, Arterial: 7.237 — ABNORMAL LOW (ref 7.350–7.450)
pO2, Arterial: 89 mmHg (ref 83.0–108.0)

## 2017-12-28 LAB — MAGNESIUM
MAGNESIUM: 1.8 mg/dL (ref 1.7–2.4)
MAGNESIUM: 1.7 mg/dL (ref 1.7–2.4)

## 2017-12-28 LAB — TRIGLYCERIDES: TRIGLYCERIDES: 139 mg/dL (ref <150)

## 2017-12-28 MED ORDER — BACITRACIN ZINC 500 UNIT/GM EX OINT
TOPICAL_OINTMENT | Freq: Two times a day (BID) | CUTANEOUS | Status: DC
  Administered 2017-12-28 – 2017-12-29 (×2): 31.5556 via TOPICAL
  Administered 2017-12-29 – 2017-12-31 (×3): 1 via TOPICAL
  Administered 2017-12-31: 31.5556 via TOPICAL
  Administered 2018-01-01: 1 via TOPICAL
  Administered 2018-01-02: 31.5556 via TOPICAL
  Administered 2018-01-02: 22:00:00 via TOPICAL
  Administered 2018-01-03: 31.5556 via TOPICAL
  Administered 2018-01-03 – 2018-01-04 (×2): 1 via TOPICAL
  Administered 2018-01-04 – 2018-01-09 (×9): 31.5556 via TOPICAL
  Administered 2018-01-09: 1 via TOPICAL
  Filled 2017-12-28 (×2): qty 28.4

## 2017-12-28 MED ORDER — CLONAZEPAM 0.5 MG PO TABS
0.2500 mg | ORAL_TABLET | Freq: Two times a day (BID) | ORAL | Status: DC
  Administered 2017-12-28 – 2018-01-01 (×6): 0.25 mg
  Filled 2017-12-28 (×7): qty 1

## 2017-12-28 MED ORDER — VITAL HIGH PROTEIN PO LIQD: 1000.0000 mL | ORAL | Status: DC

## 2017-12-28 MED ORDER — PIVOT 1.5 CAL PO LIQD
1000.0000 mL | ORAL | Status: DC
  Administered 2017-12-28 – 2018-01-01 (×5): 1000 mL

## 2017-12-28 MED ORDER — PRO-STAT SUGAR FREE PO LIQD
30.0000 mL | Freq: Two times a day (BID) | ORAL | Status: DC
  Administered 2017-12-28 – 2018-01-01 (×9): 30 mL
  Filled 2017-12-28 (×10): qty 30

## 2017-12-28 MED ORDER — SELENIUM 200 MCG PO TABS
200.0000 ug | ORAL_TABLET | Freq: Every day | ORAL | Status: DC
  Administered 2017-12-28 – 2017-12-31 (×4): 200 ug
  Filled 2017-12-28 (×6): qty 1

## 2017-12-28 MED ORDER — QUETIAPINE FUMARATE 25 MG PO TABS
50.0000 mg | ORAL_TABLET | Freq: Two times a day (BID) | ORAL | Status: DC
  Administered 2017-12-28 – 2018-01-01 (×6): 50 mg
  Filled 2017-12-28 (×7): qty 2

## 2017-12-28 MED ORDER — PANTOPRAZOLE SODIUM 40 MG PO PACK
40.0000 mg | PACK | Freq: Every day | ORAL | Status: DC
  Administered 2017-12-28 – 2017-12-31 (×4): 40 mg
  Filled 2017-12-28 (×4): qty 20

## 2017-12-28 MED ORDER — VITAMIN C 500 MG PO TABS
1000.0000 mg | ORAL_TABLET | Freq: Three times a day (TID) | ORAL | Status: DC
  Administered 2017-12-28 – 2018-01-02 (×15): 1000 mg
  Filled 2017-12-28 (×16): qty 2

## 2017-12-28 MED ORDER — IBUPROFEN 100 MG/5ML PO SUSP
600.0000 mg | Freq: Once | ORAL | Status: AC
  Administered 2017-12-28: 600 mg
  Filled 2017-12-28: qty 30

## 2017-12-28 NOTE — Progress Notes (Signed)
During report I was told pt has been awake and following commands. The pt was started on klonopin and Seroquel today, and has not been herself since. I held her PM dose and turned off all pain medicine and sedation.  Pt has still not woke up for me and followed commands as she has in previous days.  Paged Ramierez for STAT head CT. Will call when results have been posted. Will continue to monitor pt closely
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
## 2017-12-28 NOTE — Progress Notes (Addendum)
Patient ID: Dorothy Wilson, female   DOB: 12/16/1970, 47 y.o.   MRN: [id number] Follow up - Trauma Critical Care  Patient Details:    Dorothy Wilson is an 47 y.o. female.  Lines/tubes : Airway 7.5 mm (Active)  Secured at (cm) 24 cm 12/28/2017  8:05 AM  Measured From Lips 12/28/2017  8:05 AM  Secured Location Right 12/28/2017  8:05 AM  Secured By [employer] Tube Holder 12/28/2017  8:05 AM  Tube Holder Repositioned Yes 12/28/2017  8:05 AM  Cuff Pressure (cm H2O) 29 cm H2O 12/28/2017  8:05 AM  Site Condition Dry 12/28/2017  8:05 AM     CVC Triple Lumen 12/25/17 Left Femoral (Active)  Indication for Insertion or Continuance of Line Vasoactive infusions 12/27/2017  8:00 PM  Site Assessment Clean;Dry;Intact 12/27/2017  8:00 PM  Proximal Lumen Status Infusing 12/27/2017  8:00 PM  Medial Lumen Status Infusing 12/27/2017  8:00 PM  Distal Lumen Status Capped (Central line);Flushed;Saline locked 12/27/2017  8:00 PM  Dressing Type Transparent;Occlusive 12/27/2017  8:00 PM  Dressing Status Clean;Dry;Intact;Antimicrobial disc in place 12/27/2017  8:00 PM  Line Care Proximal tubing changed 12/28/2017  5:00 AM  Dressing Intervention New dressing;Dressing changed;Antimicrobial disc changed 12/27/2017  1:00 AM  Dressing Change Due 01/03/18 12/27/2017  8:00 PM     Negative Pressure Wound Therapy Knee Anterior;Right (Active)  Site / Wound Assessment Dressing in place / Unable to assess 12/27/2017  8:00 PM  Peri-wound Assessment Other (Comment) 12/27/2017  4:00 PM  Target Pressure (mmHg) 125 12/27/2017  8:00 PM  Canister Changed No 12/27/2017  8:00 PM  Dressing Status Intact 12/27/2017  8:00 PM  Drainage Amount Moderate 12/27/2017  8:00 PM  Drainage Description Sanguineous 12/27/2017  8:00 PM  Output (mL) 250 mL 12/28/2017  5:00 AM     NG/OG Tube Orogastric 18 Fr. Center mouth Xray (Active)  Site Assessment Clean;Dry;Intact 12/27/2017  8:00 PM  Ongoing Placement Verification No acute changes, not  attributed to clinical condition;No change in respiratory status 12/27/2017  8:00 PM  Status Suction-low intermittent 12/27/2017  8:00 PM  Amount of suction 110 mmHg 12/27/2017  8:00 PM  Drainage Appearance Bile;Brown 12/27/2017  8:00 PM  Intake (mL) 50 mL 12/27/2017 10:00 PM  Output (mL) 100 mL 12/28/2017  5:00 AM     Urethral Catheter Pennie Banterallie Straughn, RN  Latex;Straight-tip;Temperature probe 14 Fr. (Active)  Indication for Insertion or Continuance of Catheter Peri-operative use for selective surgical procedure 12/27/2017  8:00 PM  Site Assessment Clean;Intact 12/27/2017  8:00 PM  Catheter Maintenance Bag below level of bladder;Catheter secured;Drainage bag/tubing not touching floor;Insertion date on drainage bag;No dependent loops;Seal intact 12/27/2017  8:00 PM  Collection Container Standard drainage bag 12/27/2017  8:00 PM  Securement Method Securing device (Describe) 12/27/2017  8:00 PM  Urinary Catheter Interventions Unclamped 12/26/2017  8:00 AM  Output (mL) 900 mL 12/28/2017  6:00 AM    Microbiology/Sepsis markers: Results for orders placed or performed during the hospital encounter of 12/25/17  Urine culture     Status: None   Collection Time: 12/25/17 10:06 PM  Result Value Ref Range Status   Specimen Description URINE, CATHETERIZED  Final   Special Requests Normal  Final   Culture   Final    NO GROWTH Performed at [facility], [address] [place], [place] [address]    Report Status 12/27/2017 FINAL  Final  MRSA PCR Screening     Status: None   Collection Time: 12/25/17 11:37 PM  Result Value Ref Range Status   MRSA by PCR NEGATIVE NEGATIVE Final    Comment:        The GeneXpert MRSA Assay (FDA approved for NASAL specimens only), is one component of a comprehensive MRSA colonization surveillance program. It is not intended to diagnose MRSA infection nor to guide or monitor treatment for MRSA infections. Performed at [facility], [address]  [place], [place] [address]   Surgical PCR screen     Status: None   Collection Time: 12/26/17 10:56 AM  Result Value Ref Range Status   MRSA, PCR NEGATIVE NEGATIVE Final   Staphylococcus aureus NEGATIVE NEGATIVE Final    Comment: (NOTE) The Xpert SA Assay (FDA approved for NASAL specimens in patients 49 years of age and older), is one component of a comprehensive surveillance program. It is not intended to diagnose infection nor to guide or monitor treatment. Performed at [facility], [address] [place], [place] [address]     Anti-infectives:  Anti-infectives (From admission, onward)   Start     Dose/Rate Route Frequency Ordered Stop   12/27/17 1056  vancomycin (VANCOCIN) powder  Status:  Discontinued       As needed 12/27/17 1056 12/27/17 1212   12/27/17 1000  ceFAZolin (ANCEF) IVPB 1 g/50 mL premix     1 g 100 mL/hr over 30 Minutes Intravenous  Once 12/27/17 1000     12/26/17 2000  cefTRIAXone (ROCEPHIN) 2 g in sodium chloride 0.9 % 100 mL IVPB     2 g 200 mL/hr over 30 Minutes Intravenous Every 24 hours 12/26/17 1857     12/26/17 1540  vancomycin (VANCOCIN) powder  Status:  Discontinued       As needed 12/26/17 1542 12/26/17 1656   12/26/17 1539  tobramycin (NEBCIN) powder  Status:  Discontinued       As needed 12/26/17 1539 12/26/17 1656   12/26/17 1200  ceFAZolin (ANCEF) IVPB 2g/100 mL premix  Status:  Discontinued     2 g 200 mL/hr over 30 Minutes Intravenous Every 8 hours 12/26/17 0640 12/26/17 1857   12/26/17 0400  ceFAZolin (ANCEF) IVPB 1 g/50 mL premix  Status:  Discontinued     1 g 100 mL/hr over 30 Minutes Intravenous Every 8 hours 12/25/17 2140 12/26/17 0640   12/25/17 1907  ceFAZolin (ANCEF) 2-4 GM/100ML-% IVPB    Note to Pharmacy:  Lysle Pearlumbarger, Rachel   : cabinet override      12/25/17 1907 12/25/17 2030      Consults: Treatment Team:  Yolonda Kidaogers, Jason Patrick, MD Haddix, Gillie MannersKevin P, MD Coletta Memosabbell, Kyle, MD   Subjective:    Overnight Issues:    Objective:  Vital signs for last 24 hours: Temp:  [96.6 F (35.9 C)-101.9 F (38.8 C)] 99.7 F (37.6 C) (08/17 0700) Pulse Rate:  [62-134] 95 (08/17 0805) Resp:  [12-21] 15 (08/17 0805) BP: (104-159)/(39-85) 138/67 (08/17 0805) SpO2:  [97 %-100 %] 100 % (08/17 0805) FiO2 (%):  [40 %] 40 % (08/17 0805)  Hemodynamic parameters for last 24 hours:    Intake/Output from previous day: 08/16 0701 - 08/17 0700 In: 6523.4 [I.V.:4613.4; Blood:1260; NG/GT:50; IV Piggyback:600] Out: 2850 [Urine:2000; Emesis/NG output:100; Drains:450; Blood:300]  Intake/Output this shift: No intake/output data recorded.  Vent settings for last 24 hours: Vent Mode: PRVC FiO2 (%):  [40 %] 40 % Set Rate:  [16 bmp] 16 bmp Vt Set:  [550 mL] 550 mL PEEP:  [5 cmH20] 5 cmH20 Plateau  Pressure:  [20 cmH20-28 cmH20] 24 cmH20  Physical Exam:  General: on vent Neuro: sedated HEENT/Neck: ETT Resp: clear to auscultation bilaterally CVS: regular rate and rhythm, S1, S2 normal, no murmur, click, rub or gallop GI: soft, nontender, BS WNL, no r/g Extremities: ex fix RLE, L hand splint  Results for orders placed or performed during the hospital encounter of 12/25/17 (from the past 24 hour(s))  Prepare RBC     Status: None   Collection Time: 12/27/17  8:27 AM  Result Value Ref Range   Order Confirmation      ORDER PROCESSED BY BLOOD BANK Performed at [facility], [address] [place], [place] [address]   Glucose, capillary     Status: Abnormal   Collection Time: 12/27/17  8:35 AM  Result Value Ref Range   Glucose-Capillary 68 (L) 70 - 99 mg/dL  Prepare RBC     Status: None   Collection Time: 12/27/17  8:51 AM  Result Value Ref Range   Order Confirmation      ORDER PROCESSED BY BLOOD BANK Performed at [facility], [address] [place], [place] [address]   Glucose, capillary     Status: Abnormal   Collection Time: 12/27/17 10:34 AM  Result Value Ref Range   Glucose-Capillary 146 (H)  70 - 99 mg/dL  BLOOD TRANSFUSION REPORT - SCANNED     Status: None   Collection Time: 12/27/17 11:28 AM   Narrative   Ordered by an unspecified provider.  Glucose, capillary     Status: Abnormal   Collection Time: 12/27/17 12:48 PM  Result Value Ref Range   Glucose-Capillary 33 (LL) 70 - 99 mg/dL   Comment 1 Notify RN   Glucose, capillary     Status: Abnormal   Collection Time: 12/27/17 12:59 PM  Result Value Ref Range   Glucose-Capillary 111 (H) 70 - 99 mg/dL  Glucose, capillary     Status: None   Collection Time: 12/27/17  3:59 PM  Result Value Ref Range   Glucose-Capillary 74 70 - 99 mg/dL  Glucose, capillary     Status: Abnormal   Collection Time: 12/27/17  4:33 PM  Result Value Ref Range   Glucose-Capillary 109 (H) 70 - 99 mg/dL  Glucose, capillary     Status: None   Collection Time: 12/27/17  8:57 PM  Result Value Ref Range   Glucose-Capillary 81 70 - 99 mg/dL  CBC     Status: Abnormal   Collection Time: 12/27/17 10:11 PM  Result Value Ref Range   WBC 8.1 4.0 - 10.5 K/uL   RBC 3.44 (L) 3.87 - 5.11 MIL/uL   Hemoglobin 9.9 (L) 12.0 - 15.0 g/dL   HCT 09.8 (L) 11.9 - 14.7 %   MCV 91.0 78.0 - 100.0 fL   MCH 28.8 26.0 - 34.0 pg   MCHC 31.6 30.0 - 36.0 g/dL   RDW 82.9 56.2 - 13.0 %   Platelets 74 (L) 150 - 400 K/uL  Glucose, capillary     Status: Abnormal   Collection Time: 12/28/17 12:53 AM  Result Value Ref Range   Glucose-Capillary 114 (H) 70 - 99 mg/dL  I-STAT 3, arterial blood gas (G3+)     Status: Abnormal   Collection Time: 12/28/17  3:43 AM  Result Value Ref Range   pH, Arterial 7.237 (L) 7.350 - 7.450   pCO2 arterial 56.7 (H) 32.0 - 48.0 mmHg   pO2, Arterial 89.0 83.0 - 108.0 mmHg  Bicarbonate 23.9 20.0 - 28.0 mmol/L   TCO2 26 22 - 32 mmol/L   O2 Saturation 94.0 %   Acid-base deficit 3.0 (H) 0.0 - 2.0 mmol/L   Patient temperature 37.7 C    Collection site RADIAL, ALLEN'S TEST ACCEPTABLE    Sample type ARTERIAL   CBC     Status: Abnormal   Collection  Time: 12/28/17  5:43 AM  Result Value Ref Range   WBC 8.8 4.0 - 10.5 K/uL   RBC 3.29 (L) 3.87 - 5.11 MIL/uL   Hemoglobin 9.5 (L) 12.0 - 15.0 g/dL   HCT 16.1 (L) 09.6 - 04.5 %   MCV 91.5 78.0 - 100.0 fL   MCH 28.9 26.0 - 34.0 pg   MCHC 31.6 30.0 - 36.0 g/dL   RDW 40.9 81.1 - 91.4 %   Platelets 85 (L) 150 - 400 K/uL  Basic metabolic panel     Status: Abnormal   Collection Time: 12/28/17  5:43 AM  Result Value Ref Range   Sodium 145 135 - 145 mmol/L   Potassium 4.7 3.5 - 5.1 mmol/L   Chloride 115 (H) 98 - 111 mmol/L   CO2 26 22 - 32 mmol/L   Glucose, Bld 102 (H) 70 - 99 mg/dL   BUN 8 6 - 20 mg/dL   Creatinine, Ser 7.82 0.44 - 1.00 mg/dL   Calcium 7.7 (L) 8.9 - 10.3 mg/dL   GFR calc non Af Amer >60 >60 mL/min   GFR calc Af Amer >60 >60 mL/min   Anion gap 4 (L) 5 - 15    Assessment & Plan: Present on Admission: . Liver laceration, closed, initial encounter    LOS: 3 days   Additional comments:I reviewed the patient's new clinical lab test results. and CXR PHBC Acute hypoxic ventilator dependent respiratory failure - wean but will not extubate Mult R rib FX with HPTX/pulm contusion - no PTX on CXR  Hemorrhagic shock - has resolved Grade 4 liver lac - no extrav, bedrest for 72h TBI/SDH adjacent to L frontal arachnoid cyst - per Dr. Franky Macho ABL anemia  Thrombocytopenia - consumptive AKI Open R tib fib - washed out in ED. S/P Ex fix by Dr. Jena Gauss 8/15 R humerus FX - per Dr. Jena Gauss R second distal phalanx FX - per Dr. Jena Gauss L 2-4 MC FXs - S/P ORIF/CR by Dr. Jena Gauss 8/16 L L2-3 TVP FXs FEN - TF , Klon/Sero VTE - no Lovenox yet as PLT<100k Dispo - ICU  Critical Care Total Time*: 41 Minutes  Violeta Gelinas, MD, MPH, FACS Trauma: [phone] General Surgery: [phone]  12/28/2017  *Care during the described time interval was provided by me. I have reviewed this patient's available data, including medical history, events of note, physical examination and test results  as part of my evaluation.

## 2017-12-28 NOTE — Plan of Care (Signed)
Pt starting tube feeding today

## 2017-12-28 NOTE — Progress Notes (Signed)
Subjective: 1 Day Post-Op Procedure(s) (LRB): OPEN REDUCTION INTERNAL FIXATION (ORIF) METACARPAL (Left) OPEN REDUCTION INTERNAL FIXATION (ORIF) HUMERAL SHAFT FRACTURE (Right) Patient is sedated comfortably with Fentanyl Seroquel and clonidine now.   Answered questions and was responsive this am per the nursing staff.  Objective: Vital signs in last 24 hours: Temp:  [96.8 F (36 C)-101.9 F (38.8 C)] 101.3 F (38.5 C) (08/17 1610) Pulse Rate:  [83-134] 126 (08/17 1610) Resp:  [13-21] 17 (08/17 1610) BP: (105-163)/(39-85) 163/73 (08/17 1600) SpO2:  [97 %-100 %] 99 % (08/17 1610) FiO2 (%):  [40 %] 40 % (08/17 1537)  Intake/Output from previous day: 08/16 0701 - 08/17 0700 In: 6523.4 [I.V.:4613.4; Blood:1260; NG/GT:50; IV Piggyback:600] Out: 2850 [Urine:2000; Emesis/NG output:100; Drains:450; Blood:300] Intake/Output this shift: Total I/O In: 514.5 [I.V.:514.5] Out: 375 [Urine:325; Drains:50]  Recent Labs    12/27/17 0500 12/27/17 1036 12/27/17 1155 12/27/17 2211 12/28/17 0543  HGB 7.3* 6.8* 9.9* 9.9* 9.5*   Recent Labs    12/27/17 2211 12/28/17 0543  WBC 8.1 8.8  RBC 3.44* 3.29*  HCT 31.3* 30.1*  PLT 74* 85*   Recent Labs    12/26/17 0445  12/27/17 1155 12/28/17 0543  NA 141   < > 147* 145  K 4.0   < > 4.7 4.7  CL 107  --   --  115*  CO2 24  --   --  26  BUN 16  --   --  8  CREATININE 1.45*  --   --  0.90  GLUCOSE 228*  --   --  102*  CALCIUM 6.8*  --   --  7.7*   < > = values in this interval not displayed.   Recent Labs    12/26/17 0114 12/26/17 0445  INR 1.29 1.26   Patient has 2+ radial and dorsalis pedis pulses bilaterally.  Left leg fixator stable with slight drainage from distal pins  Assessment/Plan: 1 Day Post-Op Procedure(s) (LRB): OPEN REDUCTION INTERNAL FIXATION (ORIF) METACARPAL (Left) OPEN REDUCTION INTERNAL FIXATION (ORIF) HUMERAL SHAFT FRACTURE (Right)    Patient is deaf   Family is at bedside and said this am she was much more  alert and was able to recognize other family members.  She is schedule for surgery Monday.    Geron Mulford J Rashanda Magloire 12/28/2017, 4:21 PM

## 2017-12-29 ENCOUNTER — Inpatient Hospital Stay (HOSPITAL_COMMUNITY): Payer: No Typology Code available for payment source

## 2017-12-29 LAB — TYPE AND SCREEN
ABO/RH(D): O POS
Antibody Screen: NEGATIVE
UNIT DIVISION: 0
UNIT DIVISION: 0
UNIT DIVISION: 0
UNIT DIVISION: 0
UNIT DIVISION: 0
Unit division: 0
Unit division: 0
Unit division: 0
Unit division: 0
Unit division: 0
Unit division: 0
Unit division: 0

## 2017-12-29 LAB — BPAM RBC
BLOOD PRODUCT EXPIRATION DATE: 201909072359
BLOOD PRODUCT EXPIRATION DATE: 201909072359
BLOOD PRODUCT EXPIRATION DATE: 201909092359
BLOOD PRODUCT EXPIRATION DATE: 201909092359
BLOOD PRODUCT EXPIRATION DATE: 201909142359
BLOOD PRODUCT EXPIRATION DATE: 201909142359
Blood Product Expiration Date: 201909092359
Blood Product Expiration Date: 201909092359
Blood Product Expiration Date: 201909132359
Blood Product Expiration Date: 201909132359
Blood Product Expiration Date: 201909142359
Blood Product Expiration Date: 201909142359
ISSUE DATE / TIME: 201908141901
ISSUE DATE / TIME: 201908141901
ISSUE DATE / TIME: 201908142023
ISSUE DATE / TIME: 201908142023
ISSUE DATE / TIME: 201908142108
ISSUE DATE / TIME: 201908142108
ISSUE DATE / TIME: 201908160831
ISSUE DATE / TIME: 201908160831
ISSUE DATE / TIME: 201908161009
ISSUE DATE / TIME: 201908161009
UNIT TYPE AND RH: 5100
UNIT TYPE AND RH: 5100
UNIT TYPE AND RH: 5100
Unit Type and Rh: 5100
Unit Type and Rh: 5100
Unit Type and Rh: 5100
Unit Type and Rh: 5100
Unit Type and Rh: 5100
Unit Type and Rh: 5100
Unit Type and Rh: 5100
Unit Type and Rh: 9500
Unit Type and Rh: 9500

## 2017-12-29 LAB — CBC
HCT: 28.4 % — ABNORMAL LOW (ref 36.0–46.0)
Hemoglobin: 8.9 g/dL — ABNORMAL LOW (ref 12.0–15.0)
MCH: 29.1 pg (ref 26.0–34.0)
MCHC: 31.3 g/dL (ref 30.0–36.0)
MCV: 92.8 fL (ref 78.0–100.0)
PLATELETS: 90 10*3/uL — AB (ref 150–400)
RBC: 3.06 MIL/uL — ABNORMAL LOW (ref 3.87–5.11)
RDW: 14.7 % (ref 11.5–15.5)
WBC: 8.7 10*3/uL (ref 4.0–10.5)

## 2017-12-29 LAB — PHOSPHORUS
PHOSPHORUS: 2.5 mg/dL (ref 2.5–4.6)
PHOSPHORUS: 2.5 mg/dL (ref 2.5–4.6)

## 2017-12-29 LAB — BASIC METABOLIC PANEL
Anion gap: 5 (ref 5–15)
BUN: 12 mg/dL (ref 6–20)
CALCIUM: 7.8 mg/dL — AB (ref 8.9–10.3)
CO2: 27 mmol/L (ref 22–32)
CREATININE: 0.76 mg/dL (ref 0.44–1.00)
Chloride: 113 mmol/L — ABNORMAL HIGH (ref 98–111)
Glucose, Bld: 133 mg/dL — ABNORMAL HIGH (ref 70–99)
Potassium: 4.2 mmol/L (ref 3.5–5.1)
Sodium: 145 mmol/L (ref 135–145)

## 2017-12-29 LAB — GLUCOSE, CAPILLARY
Glucose-Capillary: 118 mg/dL — ABNORMAL HIGH (ref 70–99)
Glucose-Capillary: 140 mg/dL — ABNORMAL HIGH (ref 70–99)
Glucose-Capillary: 112 mg/dL — ABNORMAL HIGH (ref 70–99)
Glucose-Capillary: 126 mg/dL — ABNORMAL HIGH (ref 70–99)
Glucose-Capillary: 118 mg/dL — ABNORMAL HIGH (ref 70–99)

## 2017-12-29 LAB — MAGNESIUM
MAGNESIUM: 2 mg/dL (ref 1.7–2.4)
MAGNESIUM: 1.8 mg/dL (ref 1.7–2.4)

## 2017-12-29 MED ORDER — HYDRALAZINE HCL 20 MG/ML IJ SOLN
20.0000 mg | INTRAMUSCULAR | Status: DC | PRN
  Administered 2017-12-29 – 2017-12-30 (×3): 20 mg via INTRAVENOUS
  Filled 2017-12-29 (×3): qty 1

## 2017-12-29 NOTE — Progress Notes (Signed)
Subjective: 2 Days Post-Op Procedure(s) (LRB): OPEN REDUCTION INTERNAL FIXATION (ORIF) METACARPAL (Left) OPEN REDUCTION INTERNAL FIXATION (ORIF) HUMERAL SHAFT FRACTURE (Right) Patient reports pain as 2 on 0-10 scale.    Objective: Vital signs in last 24 hours: Temp:  [98.2 F (36.8 C)-101.5 F (38.6 C)] 100.4 F (38 C) (08/18 1330) Pulse Rate:  [84-126] 108 (08/18 1330) Resp:  [16-21] 19 (08/18 1330) BP: (111-195)/(58-105) 169/75 (08/18 1330) SpO2:  [99 %-100 %] 100 % (08/18 1330) FiO2 (%):  [40 %] 40 % (08/18 1204) Weight:  [130.8 kg-134.2 kg] 134.2 kg (08/18 0500)  Intake/Output from previous day: 08/17 0701 - 08/18 0700 In: 2171.5 [I.V.:1446.1; NG/GT:625.3; IV Piggyback:100] Out: 1600 [Urine:775; Emesis/NG output:400; Drains:425] Intake/Output this shift: Total I/O In: 634.6 [I.V.:354.6; NG/GT:280] Out: 1200 [Urine:700; Drains:500]  Recent Labs    12/27/17 1155 12/27/17 2211 12/28/17 0543 12/28/17 1700 12/29/17 0511  HGB 9.9* 9.9* 9.5* 9.0* 8.9*   Recent Labs    12/28/17 1700 12/29/17 0511  WBC 8.2 8.7  RBC 3.08* 3.06*  HCT 27.9* 28.4*  PLT 65* 90*   Recent Labs    12/28/17 0543 12/29/17 0511  NA 145 145  K 4.7 4.2  CL 115* 113*  CO2 26 27  BUN 8 12  CREATININE 0.90 0.76  GLUCOSE 102* 133*  CALCIUM 7.7* 7.8*   No results for input(s): LABPT, INR in the last 72 hours.  ABD soft Intact pulses distally Patient moved left arm to her face during exam   Much more alert than yesterday.  Pulses are good extremities are warm   Assessment/Plan: 2 Days Post-Op Procedure(s) (LRB): OPEN REDUCTION INTERNAL FIXATION (ORIF) METACARPAL (Left) OPEN REDUCTION INTERNAL FIXATION (ORIF) HUMERAL SHAFT FRACTURE (Right) Active Problems:   Liver laceration, closed, initial encounter  Plan is to take to the OR tomorrow am for removal of ex fix and treatment of right leg fractures       Coty Student J Sammye Staff 12/29/2017, 3:07 PM

## 2017-12-29 NOTE — Plan of Care (Signed)
Pt assists with turns

## 2017-12-29 NOTE — Progress Notes (Signed)
Assisted RN with transport from 4N ICU to CT and back without any complications.
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
## 2017-12-29 NOTE — Progress Notes (Signed)
Follow up - Trauma Critical Care  Patient Details:    Dorothy HueMarilyn Lezlie Wilson is an 47 y.o. female.  Lines/tubes : Airway 7.5 mm (Active)  Secured at (cm) 24 cm 12/29/2017  7:49 AM  Measured From Lips 12/29/2017  7:49 AM  Secured Location Right 12/29/2017  7:49 AM  Secured By [employer] Tube Holder 12/29/2017  7:49 AM  Tube Holder Repositioned Yes 12/29/2017  7:49 AM  Cuff Pressure (cm H2O) 24 cm H2O 12/29/2017  7:49 AM  Site Condition Dry 12/29/2017  7:49 AM     CVC Triple Lumen 12/25/17 Left Femoral (Active)  Indication for Insertion or Continuance of Line Vasoactive infusions 12/28/2017  8:00 PM  Site Assessment Clean;Dry;Intact 12/28/2017  8:00 PM  Proximal Lumen Status Infusing 12/28/2017  8:00 PM  Medial Lumen Status Infusing 12/28/2017  8:00 PM  Distal Lumen Status Flushed;Capped (Central line) 12/28/2017  8:00 PM  Dressing Type Transparent 12/28/2017  8:00 PM  Dressing Status Clean;Dry;Intact;Antimicrobial disc in place 12/28/2017  8:00 PM  Line Care Connections checked and tightened 12/28/2017  8:00 PM  Dressing Intervention New dressing;Dressing changed;Antimicrobial disc changed 12/27/2017  1:00 AM  Dressing Change Due 01/03/18 12/28/2017  8:00 PM     Negative Pressure Wound Therapy Knee Anterior;Right (Active)  Site / Wound Assessment Dressing in place / Unable to assess 12/28/2017  8:00 PM  Peri-wound Assessment Other (Comment) 12/27/2017  4:00 PM  Target Pressure (mmHg) 125 12/28/2017  8:00 PM  Canister Changed No 12/28/2017  8:00 PM  Dressing Status Intact 12/28/2017  8:00 PM  Drainage Amount Moderate 12/28/2017  8:00 PM  Drainage Description Sanguineous 12/28/2017  8:00 PM  Output (mL) 150 mL 12/29/2017  6:00 AM     NG/OG Tube Orogastric 18 Fr. Center mouth Xray (Active)  Site Assessment Clean;Dry;Intact 12/28/2017  8:00 PM  Ongoing Placement Verification No acute changes, not attributed to clinical condition;No change in respiratory status 12/28/2017  8:00 PM  Status Infusing tube feed  12/28/2017  8:00 PM  Amount of suction 110 mmHg 12/28/2017  8:00 AM  Drainage Appearance Bile;Brown 12/28/2017  8:00 AM  Intake (mL) 80 mL 12/28/2017  8:00 PM  Output (mL) 400 mL 12/28/2017  6:30 PM     Urethral Catheter Pennie Banterallie Straughn, RN  Latex;Straight-tip;Temperature probe 14 Fr. (Active)  Indication for Insertion or Continuance of Catheter Peri-operative use for selective surgical procedure 12/28/2017  8:00 PM  Site Assessment Clean;Intact 12/28/2017  8:00 PM  Catheter Maintenance Bag below level of bladder;Catheter secured;Drainage bag/tubing not touching floor;Insertion date on drainage bag;No dependent loops;Seal intact 12/28/2017  8:00 PM  Collection Container Standard drainage bag 12/28/2017  8:00 PM  Securement Method Securing device (Describe) 12/28/2017  8:00 PM  Urinary Catheter Interventions Unclamped 12/28/2017  8:00 AM  Output (mL) 75 mL 12/29/2017  6:00 AM    Microbiology/Sepsis markers: Results for orders placed or performed during the hospital encounter of 12/25/17  Urine culture     Status: None   Collection Time: 12/25/17 10:06 PM  Result Value Ref Range Status   Specimen Description URINE, CATHETERIZED  Final   Special Requests Normal  Final   Culture   Final    NO GROWTH Performed at [facility], [address] [place], [place] [address]    Report Status 12/27/2017 FINAL  Final  MRSA PCR Screening     Status: None   Collection Time: 12/25/17 11:37 PM  Result Value Ref Range Status   MRSA by PCR NEGATIVE NEGATIVE Final  Comment:        The GeneXpert MRSA Assay (FDA approved for NASAL specimens only), is one component of a comprehensive MRSA colonization surveillance program. It is not intended to diagnose MRSA infection nor to guide or monitor treatment for MRSA infections. Performed at [facility], [address] [place], [place] [address]   Surgical PCR screen     Status: None   Collection Time: 12/26/17 10:56 AM  Result Value Ref Range  Status   MRSA, PCR NEGATIVE NEGATIVE Final   Staphylococcus aureus NEGATIVE NEGATIVE Final    Comment: (NOTE) The Xpert SA Assay (FDA approved for NASAL specimens in patients 49 years of age and older), is one component of a comprehensive surveillance program. It is not intended to diagnose infection nor to guide or monitor treatment. Performed at [facility], [address] [place], [place] [address]     Anti-infectives:  Anti-infectives (From admission, onward)   Start     Dose/Rate Route Frequency Ordered Stop   12/27/17 1056  vancomycin (VANCOCIN) powder  Status:  Discontinued       As needed 12/27/17 1056 12/27/17 1212   12/27/17 1000  ceFAZolin (ANCEF) IVPB 1 g/50 mL premix     1 g 100 mL/hr over 30 Minutes Intravenous  Once 12/27/17 1000     12/26/17 2000  cefTRIAXone (ROCEPHIN) 2 g in sodium chloride 0.9 % 100 mL IVPB     2 g 200 mL/hr over 30 Minutes Intravenous Every 24 hours 12/26/17 1857     12/26/17 1540  vancomycin (VANCOCIN) powder  Status:  Discontinued       As needed 12/26/17 1542 12/26/17 1656   12/26/17 1539  tobramycin (NEBCIN) powder  Status:  Discontinued       As needed 12/26/17 1539 12/26/17 1656   12/26/17 1200  ceFAZolin (ANCEF) IVPB 2g/100 mL premix  Status:  Discontinued     2 g 200 mL/hr over 30 Minutes Intravenous Every 8 hours 12/26/17 0640 12/26/17 1857   12/26/17 0400  ceFAZolin (ANCEF) IVPB 1 g/50 mL premix  Status:  Discontinued     1 g 100 mL/hr over 30 Minutes Intravenous Every 8 hours 12/25/17 2140 12/26/17 0640   12/25/17 1907  ceFAZolin (ANCEF) 2-4 GM/100ML-% IVPB    Note to Pharmacy:  Lysle Pearlumbarger, Rachel   : cabinet override      12/25/17 1907 12/25/17 2030    Consults: Treatment Team:  Yolonda Kidaogers, Jason Patrick, MD Haddix, Gillie MannersKevin P, MD    Studies:    Events:  Subjective:    Overnight Issues:   Objective:  Vital signs for last 24 hours: Temp:  [98.2 F (36.8 C)-101.5 F (38.6 C)] 99.5 F (37.5 C) (08/18 0749) Pulse  Rate:  [84-126] 105 (08/18 0749) Resp:  [14-19] 17 (08/18 0749) BP: (111-178)/(58-82) 178/68 (08/18 0749) SpO2:  [98 %-100 %] 100 % (08/18 0749) FiO2 (%):  [40 %] 40 % (08/18 0749) Weight:  [130.8 kg-134.2 kg] 134.2 kg (08/18 0500)  Hemodynamic parameters for last 24 hours:    Intake/Output from previous day: 08/17 0701 - 08/18 0700 In: 1048.6 [I.V.:903.3; NG/GT:145.3] Out: 1600 [Urine:775; Emesis/NG output:400; Drains:425]  Intake/Output this shift: No intake/output data recorded.  Vent settings for last 24 hours: Vent Mode: PRVC FiO2 (%):  [40 %] 40 % Set Rate:  [16 bmp] 16 bmp Vt Set:  [550 mL] 550 mL PEEP:  [5 cmH20] 5 cmH20 Plateau Pressure:  [20 cmH20-26 cmH20] 23 cmH20  Physical  Exam:  General: on vent Neuro: sedated but aroeuse HEENT/Neck: ETT Resp: clear to auscultation bilaterally CVS: regular rate and rhythm, S1, S2 normal, no murmur, click, rub or gallop GI: soft, nontender, BS WNL, no r/g Extremities: ex fix RLE  Results for orders placed or performed during the hospital encounter of 12/25/17 (from the past 24 hour(s))  Glucose, capillary     Status: None   Collection Time: 12/28/17  8:21 AM  Result Value Ref Range   Glucose-Capillary 97 70 - 99 mg/dL   Comment 1 Notify RN    Comment 2 Document in Chart   Glucose, capillary     Status: None   Collection Time: 12/28/17 12:26 PM  Result Value Ref Range   Glucose-Capillary 92 70 - 99 mg/dL   Comment 1 Notify RN    Comment 2 Document in Chart   Glucose, capillary     Status: None   Collection Time: 12/28/17  3:23 PM  Result Value Ref Range   Glucose-Capillary 90 70 - 99 mg/dL   Comment 1 Notify RN    Comment 2 Document in Chart   Magnesium     Status: None   Collection Time: 12/28/17  5:00 PM  Result Value Ref Range   Magnesium 1.7 1.7 - 2.4 mg/dL  Phosphorus     Status: None   Collection Time: 12/28/17  5:00 PM  Result Value Ref Range   Phosphorus 2.5 2.5 - 4.6 mg/dL  CBC     Status: Abnormal    Collection Time: 12/28/17  5:00 PM  Result Value Ref Range   WBC 8.2 4.0 - 10.5 K/uL   RBC 3.08 (L) 3.87 - 5.11 MIL/uL   Hemoglobin 9.0 (L) 12.0 - 15.0 g/dL   HCT 69.6 (L) 29.5 - 28.4 %   MCV 90.6 78.0 - 100.0 fL   MCH 29.2 26.0 - 34.0 pg   MCHC 32.3 30.0 - 36.0 g/dL   RDW 13.2 44.0 - 10.2 %   Platelets 65 (L) 150 - 400 K/uL  Glucose, capillary     Status: Abnormal   Collection Time: 12/28/17  7:21 PM  Result Value Ref Range   Glucose-Capillary 125 (H) 70 - 99 mg/dL  Triglycerides     Status: None   Collection Time: 12/28/17  9:40 PM  Result Value Ref Range   Triglycerides 139 <150 mg/dL  Glucose, capillary     Status: Abnormal   Collection Time: 12/28/17 11:37 PM  Result Value Ref Range   Glucose-Capillary 115 (H) 70 - 99 mg/dL  Glucose, capillary     Status: Abnormal   Collection Time: 12/29/17  3:32 AM  Result Value Ref Range   Glucose-Capillary 118 (H) 70 - 99 mg/dL  Magnesium     Status: None   Collection Time: 12/29/17  5:11 AM  Result Value Ref Range   Magnesium 2.0 1.7 - 2.4 mg/dL  Phosphorus     Status: None   Collection Time: 12/29/17  5:11 AM  Result Value Ref Range   Phosphorus 2.5 2.5 - 4.6 mg/dL  CBC     Status: Abnormal   Collection Time: 12/29/17  5:11 AM  Result Value Ref Range   WBC 8.7 4.0 - 10.5 K/uL   RBC 3.06 (L) 3.87 - 5.11 MIL/uL   Hemoglobin 8.9 (L) 12.0 - 15.0 g/dL   HCT 72.5 (L) 36.6 - 44.0 %   MCV 92.8 78.0 - 100.0 fL   MCH 29.1 26.0 - 34.0 pg  MCHC 31.3 30.0 - 36.0 g/dL   RDW 40.9 81.1 - 91.4 %   Platelets 90 (L) 150 - 400 K/uL  Basic metabolic panel     Status: Abnormal   Collection Time: 12/29/17  5:11 AM  Result Value Ref Range   Sodium 145 135 - 145 mmol/L   Potassium 4.2 3.5 - 5.1 mmol/L   Chloride 113 (H) 98 - 111 mmol/L   CO2 27 22 - 32 mmol/L   Glucose, Bld 133 (H) 70 - 99 mg/dL   BUN 12 6 - 20 mg/dL   Creatinine, Ser 7.82 0.44 - 1.00 mg/dL   Calcium 7.8 (L) 8.9 - 10.3 mg/dL   GFR calc non Af Amer >60 >60 mL/min   GFR  calc Af Amer >60 >60 mL/min   Anion gap 5 5 - 15    Assessment & Plan: Present on Admission: . Liver laceration, closed, initial encounter    LOS: 4 days   Additional comments:I reviewed the patient's new clinical lab test results. .  Critical Care Total Time*: 45 Minutes PHBC Acute hypoxic ventilator dependent respiratory failure - wean but will not extubate Mult R rib FX with HPTX/pulm contusion - no PTX on CXR  Hemorrhagic shock - has resolved Grade 4 liver lac - no extrav, bedrest for 72h total TBI/SDH adjacent to L frontal arachnoid cyst - per Dr. Franky Macho ABL anemia  Thrombocytopenia - consumptive AKI Open R tib fib - washed out in ED. S/P Ex fix by Dr. Jena Gauss 8/15 R humerus FX - per Dr. Jena Gauss R second distal phalanx FX - per Dr. Jena Gauss L 2-4 MC FXs - S/P ORIF/CR by Dr. Jena Gauss 8/16 L L2-3 TVP FXs FEN - TF , Klon/Sero VTE - no Lovenox yet as PLT<100k Dispo - ICU  Violeta Gelinas, MD, MPH, FACS Trauma: [phone] General Surgery: [phone]  12/29/2017  *Care during the described time interval was provided by me. I have reviewed this patient's available data, including medical history, events of note, physical examination and test results as part of my evaluation.  Patient ID: Dorothy Wilson, female   DOB: 14-Nov-1970, 47 y.o.   MRN: [id number]

## 2017-12-30 ENCOUNTER — Inpatient Hospital Stay (HOSPITAL_COMMUNITY): Payer: No Typology Code available for payment source | Admitting: Anesthesiology

## 2017-12-30 ENCOUNTER — Inpatient Hospital Stay (HOSPITAL_COMMUNITY): Payer: No Typology Code available for payment source

## 2017-12-30 ENCOUNTER — Encounter (HOSPITAL_COMMUNITY): Payer: Self-pay | Admitting: Anesthesiology

## 2017-12-30 ENCOUNTER — Encounter (HOSPITAL_COMMUNITY): Admission: EM | Disposition: A | Discharge status: Rehabilitation Facility | Payer: Self-pay | Source: Home / Self Care

## 2017-12-30 HISTORY — PX: I & D EXTREMITY: SHX5045

## 2017-12-30 LAB — GLUCOSE, CAPILLARY
GLUCOSE-CAPILLARY: 109 mg/dL — AB (ref 70–99)
GLUCOSE-CAPILLARY: 118 mg/dL — AB (ref 70–99)
GLUCOSE-CAPILLARY: 121 mg/dL — AB (ref 70–99)
GLUCOSE-CAPILLARY: 127 mg/dL — AB (ref 70–99)
Glucose-Capillary: 112 mg/dL — ABNORMAL HIGH (ref 70–99)
Glucose-Capillary: 115 mg/dL — ABNORMAL HIGH (ref 70–99)
Glucose-Capillary: 120 mg/dL — ABNORMAL HIGH (ref 70–99)
Glucose-Capillary: 127 mg/dL — ABNORMAL HIGH (ref 70–99)

## 2017-12-30 LAB — BASIC METABOLIC PANEL
ANION GAP: 7 (ref 5–15)
BUN: 11 mg/dL (ref 6–20)
CALCIUM: 7.8 mg/dL — AB (ref 8.9–10.3)
CHLORIDE: 110 mmol/L (ref 98–111)
CO2: 27 mmol/L (ref 22–32)
Creatinine, Ser: 0.66 mg/dL (ref 0.44–1.00)
GFR calc non Af Amer: >60 mL/min (ref >60)
GLUCOSE: 130 mg/dL — AB (ref 70–99)
Potassium: 4.3 mmol/L (ref 3.5–5.1)
Sodium: 144 mmol/L (ref 135–145)

## 2017-12-30 LAB — POCT I-STAT 3, ART BLOOD GAS (G3+)
BICARBONATE: 26.3 mmol/L (ref 20.0–28.0)
O2 Saturation: 96 %
PCO2 ART: 48.7 mmHg — AB (ref 32.0–48.0)
PH ART: 7.344 — AB (ref 7.350–7.450)
Patient temperature: 37.7
TCO2: 28 mmol/L (ref 22–32)
pO2, Arterial: 87 mmHg (ref 83.0–108.0)

## 2017-12-30 LAB — CBC
HCT: 32 % — ABNORMAL LOW (ref 36.0–46.0)
HEMOGLOBIN: 10.1 g/dL — AB (ref 12.0–15.0)
MCH: 29.1 pg (ref 26.0–34.0)
MCHC: 31.6 g/dL (ref 30.0–36.0)
MCV: 92.2 fL (ref 78.0–100.0)
Platelets: 126 10*3/uL — ABNORMAL LOW (ref 150–400)
RBC: 3.47 MIL/uL — ABNORMAL LOW (ref 3.87–5.11)
RDW: 14.6 % (ref 11.5–15.5)
WBC: 14.1 10*3/uL — AB (ref 4.0–10.5)

## 2017-12-30 SURGERY — IRRIGATION AND DEBRIDEMENT EXTREMITY
Anesthesia: General | Laterality: Right

## 2017-12-30 MED ORDER — TOBRAMYCIN SULFATE 1.2 G IJ SOLR
INTRAMUSCULAR | Status: AC
  Filled 2017-12-30: qty 1.2

## 2017-12-30 MED ORDER — FENTANYL CITRATE (PF) 250 MCG/5ML IJ SOLN
INTRAMUSCULAR | Status: AC
Start: 2017-12-30 — End: ?
  Filled 2017-12-30: qty 5

## 2017-12-30 MED ORDER — VANCOMYCIN HCL 1000 MG IV SOLR
INTRAVENOUS | Status: DC | PRN
  Administered 2017-12-30: 1000 mg via TOPICAL

## 2017-12-30 MED ORDER — ENOXAPARIN SODIUM 40 MG/0.4ML ~~LOC~~ SOLN
40.0000 mg | SUBCUTANEOUS | Status: DC
  Administered 2017-12-30 – 2018-01-09 (×11): 40 mg via SUBCUTANEOUS
  Filled 2017-12-30 (×11): qty 0.4

## 2017-12-30 MED ORDER — 0.9 % SODIUM CHLORIDE (POUR BTL) OPTIME
TOPICAL | Status: DC | PRN
  Administered 2017-12-30: 1000 mL

## 2017-12-30 MED ORDER — FENTANYL CITRATE (PF) 100 MCG/2ML IJ SOLN
INTRAMUSCULAR | Status: DC | PRN
  Administered 2017-12-30: 50 ug via INTRAVENOUS
  Administered 2017-12-30: 25 ug via INTRAVENOUS
  Administered 2017-12-30 (×2): 50 ug via INTRAVENOUS

## 2017-12-30 MED ORDER — ROCURONIUM BROMIDE 50 MG/5ML IV SOSY
PREFILLED_SYRINGE | INTRAVENOUS | Status: DC | PRN
  Administered 2017-12-30: 50 mg via INTRAVENOUS
  Administered 2017-12-30: 30 mg via INTRAVENOUS

## 2017-12-30 MED ORDER — ROCURONIUM BROMIDE 50 MG/5ML IV SOSY
PREFILLED_SYRINGE | INTRAVENOUS | Status: AC
  Filled 2017-12-30: qty 10

## 2017-12-30 MED ORDER — ROCURONIUM BROMIDE 50 MG/5ML IV SOSY
PREFILLED_SYRINGE | INTRAVENOUS | Status: AC
  Filled 2017-12-30: qty 5

## 2017-12-30 MED ORDER — LACTATED RINGERS IV SOLN
INTRAVENOUS | Status: DC | PRN
  Administered 2017-12-30: 08:00:00 via INTRAVENOUS

## 2017-12-30 MED ORDER — MIDAZOLAM HCL 2 MG/2ML IJ SOLN
INTRAMUSCULAR | Status: AC
  Filled 2017-12-30: qty 2

## 2017-12-30 MED ORDER — VANCOMYCIN HCL 1000 MG IV SOLR
INTRAVENOUS | Status: AC
  Filled 2017-12-30: qty 1000

## 2017-12-30 MED ORDER — TOBRAMYCIN SULFATE 1.2 G IJ SOLR
INTRAMUSCULAR | Status: DC | PRN
  Administered 2017-12-30: 1.2 g via TOPICAL

## 2017-12-30 MED ORDER — FENTANYL BOLUS VIA INFUSION
100.0000 ug | INTRAVENOUS | Status: DC | PRN
  Filled 2017-12-30: qty 100

## 2017-12-30 MED ORDER — SODIUM CHLORIDE 0.9 % IR SOLN
Status: DC | PRN
  Administered 2017-12-30 (×2): 3000 mL

## 2017-12-30 MED ORDER — METOPROLOL TARTRATE 25 MG/10 ML ORAL SUSPENSION
25.0000 mg | Freq: Two times a day (BID) | ORAL | Status: DC
  Administered 2017-12-30 – 2018-01-01 (×5): 25 mg
  Filled 2017-12-30 (×5): qty 10

## 2017-12-30 MED ORDER — CEFAZOLIN SODIUM-DEXTROSE 2-3 GM-%(50ML) IV SOLR
2.0000 g | Freq: Once | INTRAVENOUS | Status: AC
  Administered 2017-12-30: 2 g via INTRAVENOUS
  Filled 2017-12-30: qty 50

## 2017-12-30 MED ORDER — PROPOFOL 10 MG/ML IV BOLUS
INTRAVENOUS | Status: AC
  Filled 2017-12-30: qty 20

## 2017-12-30 MED ORDER — CEFAZOLIN SODIUM 1 G IJ SOLR
INTRAMUSCULAR | Status: AC
  Filled 2017-12-30: qty 20

## 2017-12-30 MED ORDER — LABETALOL HCL 5 MG/ML IV SOLN
10.0000 mg | INTRAVENOUS | Status: DC | PRN
  Administered 2017-12-31 – 2018-01-03 (×3): 10 mg via INTRAVENOUS
  Filled 2017-12-30 (×5): qty 4

## 2017-12-30 MED ORDER — MIDAZOLAM HCL 5 MG/5ML IJ SOLN
INTRAMUSCULAR | Status: DC | PRN
  Administered 2017-12-30: 2 mg via INTRAVENOUS

## 2017-12-30 SURGICAL SUPPLY — 64 items
BANDAGE ACE 4X5 VEL STRL LF (GAUZE/BANDAGES/DRESSINGS) ×3 IMPLANT
BANDAGE ACE 6X5 VEL STRL LF (GAUZE/BANDAGES/DRESSINGS) ×3 IMPLANT
BANDAGE ESMARK 6X9 LF (GAUZE/BANDAGES/DRESSINGS) ×1 IMPLANT
BNDG COHESIVE 4X5 TAN STRL (GAUZE/BANDAGES/DRESSINGS) ×3 IMPLANT
BNDG COHESIVE 6X5 TAN STRL LF (GAUZE/BANDAGES/DRESSINGS) IMPLANT
BNDG ESMARK 6X9 LF (GAUZE/BANDAGES/DRESSINGS) ×3
BNDG GAUZE ELAST 4 BULKY (GAUZE/BANDAGES/DRESSINGS) ×6 IMPLANT
BNDG GAUZE STRTCH 6 (GAUZE/BANDAGES/DRESSINGS) ×9 IMPLANT
BOWL SMART MIX CTS (DISPOSABLE) ×3 IMPLANT
BRUSH SCRUB SURG 4.25 DISP (MISCELLANEOUS) ×6 IMPLANT
CEMENT BONE DEPUY (Cement) ×6 IMPLANT
CLOSURE WOUND 1/2 X4 (GAUZE/BANDAGES/DRESSINGS)
COVER SURGICAL LIGHT HANDLE (MISCELLANEOUS) ×3 IMPLANT
CUFF TOURNIQUET SINGLE 18IN (TOURNIQUET CUFF) IMPLANT
DRAPE C-ARM 42X72 X-RAY (DRAPES) IMPLANT
DRAPE C-ARMOR (DRAPES) ×3 IMPLANT
DRAPE U-SHAPE 47X51 STRL (DRAPES) ×3 IMPLANT
DRSG ADAPTIC 3X8 NADH LF (GAUZE/BANDAGES/DRESSINGS) IMPLANT
DRSG MEPITEL 4X7.2 (GAUZE/BANDAGES/DRESSINGS) ×3 IMPLANT
DRSG VAC ATS LRG SENSATRAC (GAUZE/BANDAGES/DRESSINGS) ×3 IMPLANT
DRSG VERSA FOAM LRG 10X15 (GAUZE/BANDAGES/DRESSINGS) ×3 IMPLANT
ELECT REM PT RETURN 9FT ADLT (ELECTROSURGICAL) ×3
ELECTRODE REM PT RTRN 9FT ADLT (ELECTROSURGICAL) ×1 IMPLANT
EVACUATOR 1/8 PVC DRAIN (DRAIN) IMPLANT
GAUZE SPONGE 4X4 12PLY STRL (GAUZE/BANDAGES/DRESSINGS) IMPLANT
GLOVE BIO SURGEON STRL SZ7.5 (GLOVE) ×3 IMPLANT
GLOVE BIO SURGEON STRL SZ8 (GLOVE) ×3 IMPLANT
GLOVE BIOGEL PI IND STRL 7.5 (GLOVE) ×1 IMPLANT
GLOVE BIOGEL PI IND STRL 8 (GLOVE) ×1 IMPLANT
GLOVE BIOGEL PI INDICATOR 7.5 (GLOVE) ×2
GLOVE BIOGEL PI INDICATOR 8 (GLOVE) ×2
GOWN STRL REUS W/ TWL LRG LVL3 (GOWN DISPOSABLE) ×2 IMPLANT
GOWN STRL REUS W/ TWL XL LVL3 (GOWN DISPOSABLE) ×1 IMPLANT
GOWN STRL REUS W/TWL LRG LVL3 (GOWN DISPOSABLE) ×4
GOWN STRL REUS W/TWL XL LVL3 (GOWN DISPOSABLE) ×2
HANDPIECE INTERPULSE COAX TIP (DISPOSABLE)
KIT BASIN OR (CUSTOM PROCEDURE TRAY) ×3 IMPLANT
KIT TURNOVER KIT B (KITS) ×3 IMPLANT
MANIFOLD NEPTUNE II (INSTRUMENTS) ×3 IMPLANT
NEEDLE 22X1 1/2 (OR ONLY) (NEEDLE) IMPLANT
NS IRRIG 1000ML POUR BTL (IV SOLUTION) ×3 IMPLANT
PACK ORTHO EXTREMITY (CUSTOM PROCEDURE TRAY) ×3 IMPLANT
PAD ARMBOARD 7.5X6 YLW CONV (MISCELLANEOUS) ×3 IMPLANT
PADDING CAST COTTON 6X4 STRL (CAST SUPPLIES) ×6 IMPLANT
SET HNDPC FAN SPRY TIP SCT (DISPOSABLE) IMPLANT
SPONGE LAP 18X18 X RAY DECT (DISPOSABLE) ×3 IMPLANT
STAPLER VISISTAT 35W (STAPLE) IMPLANT
STOCKINETTE IMPERVIOUS 9X36 MD (GAUZE/BANDAGES/DRESSINGS) ×3 IMPLANT
STOCKINETTE IMPERVIOUS LG (DRAPES) IMPLANT
STRIP CLOSURE SKIN 1/2X4 (GAUZE/BANDAGES/DRESSINGS) IMPLANT
SUT ETHILON 2 0 PSLX (SUTURE) ×3 IMPLANT
SUT PDS AB 0 CT 36 (SUTURE) ×3 IMPLANT
SUT PDS AB 1 CT  36 (SUTURE) ×4
SUT PDS AB 1 CT 36 (SUTURE) ×2 IMPLANT
SUT PDS AB 2-0 CT1 27 (SUTURE) ×3 IMPLANT
SWAB CULTURE ESWAB REG 1ML (MISCELLANEOUS) IMPLANT
TOWEL OR 17X24 6PK STRL BLUE (TOWEL DISPOSABLE) ×6 IMPLANT
TOWEL OR 17X26 10 PK STRL BLUE (TOWEL DISPOSABLE) ×6 IMPLANT
TUBE CONNECTING 12'X1/4 (SUCTIONS) ×1
TUBE CONNECTING 12X1/4 (SUCTIONS) ×2 IMPLANT
UNDERPAD 30X30 (UNDERPADS AND DIAPERS) ×3 IMPLANT
WATER STERILE IRR 1000ML POUR (IV SOLUTION) IMPLANT
WND VAC CANISTER 500ML (MISCELLANEOUS) ×6 IMPLANT
YANKAUER SUCT BULB TIP NO VENT (SUCTIONS) ×3 IMPLANT

## 2017-12-30 NOTE — Progress Notes (Signed)
RT NOTES: Patient transported to CT on vent without incident. 
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
## 2017-12-30 NOTE — Anesthesia Procedure Notes (Signed)
Date/Time: 12/30/2017 8:25 AM Performed by: Quentin OreWalker, Daryl Quiros E, CRNA Pre-anesthesia Checklist: Patient identified, Emergency Drugs available, Suction available and Patient being monitored Patient Re-evaluated:Patient Re-evaluated prior to induction Oxygen Delivery Method: Circle system utilized Preoxygenation: Pre-oxygenation with 100% oxygen Induction Type: Inhalational induction with existing ETT Placement Confirmation: positive ETCO2 and breath sounds checked- equal and bilateral

## 2017-12-30 NOTE — Brief Op Note (Signed)
12/30/2017  9:41 PM  PATIENT:  Dorothy Wilson  47 y.o. female  PRE-OPERATIVE DIAGNOSIS:   1. Open right tibial plateau fracture 2. Retained antibiotic beads 3. Wound vac dressing  POST-OPERATIVE DIAGNOSIS:   1. Open right tibial plateau fracture 2. Retained antibiotic beads 3. Wound vac dressing  PROCEDURE:  Procedure(s): 1. IRRIGATION AND DEBRIDEMENT RIGHT OPEN TIBIA WITH REMOVAL OF BONE (Right) 2. REMOVAL AND REPLACEMENT OF ANTIBIOTIC BEADS 3. DRESSING CHANGE UNDER ANESTHESIA  SURGEON:  Surgeon(s) and Role:    * Myrene GalasHandy, Dave Mannes, MD - Primary  PHYSICIAN ASSISTANT: Montez MoritaKEITH PAUL, PA-C  ANESTHESIA:   general  EBL:  50 mL   BLOOD ADMINISTERED:none  DRAINS: none   LOCAL MEDICATIONS USED:  NONE  SPECIMEN:  No Specimen  DISPOSITION OF SPECIMEN:  N/A  COUNTS:  YES  TOURNIQUET:  * No tourniquets in log *  DICTATIO: [id number]: [id number]  PLAN OF CARE: Admit to inpatient   PATIENT DISPOSITION:  ICU - intubated and hemodynamically stable.   Delay start of Pharmacological VTE agent (>24hrs) due to surgical blood loss or risk of bleeding: no
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
## 2017-12-30 NOTE — Anesthesia Postprocedure Evaluation (Signed)
Anesthesia Post Note  Patient: Dorothy Wilson  Procedure(s) Performed: IRRIGATION AND DEBRIDEMENT KNEE POSSIBLE (Right )     Patient location during evaluation: ICU Anesthesia Type: General Level of consciousness: patient remains intubated per anesthesia plan Pain control: unable to assess. Vital Signs Assessment: post-procedure vital signs reviewed and stable Respiratory status: patient remains intubated per anesthesia plan Cardiovascular status: stable : unable to assess. Anesthetic complications: no    Last Vitals:  Vitals:   12/30/17 1200 12/30/17 1202  BP: (!) 164/76 (!) 162/75  Pulse: (!) 121 (!) 122  Resp: 19 (!) 21  Temp: 37.2 C   SpO2: 100% 100%    Last Pain:  Vitals:   12/30/17 1200  TempSrc: Bladder  PainSc:                  Mega Kinkade L Alletta Mattos
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
## 2017-12-30 NOTE — Transfer of Care (Signed)
Immediate Anesthesia Transfer of Care Note  Patient: Dorothy Wilson  Procedure(s) Performed: IRRIGATION AND DEBRIDEMENT KNEE POSSIBLE (Right )  Patient Location: NICU  Anesthesia Type:General  Level of Consciousness: sedated, unresponsive and Patient remains intubated per anesthesia plan  Airway & Oxygen Therapy: Patient remains intubated per anesthesia plan and Patient placed on Ventilator (see vital sign flow sheet for setting)  Post-op Assessment: Report given to RN and Post -op Vital signs reviewed and stable  Post vital signs: Reviewed and stable  Last Vitals:  Vitals Value Taken Time  BP    Temp    Pulse    Resp    SpO2      Last Pain:  Vitals:   12/30/17 0800  TempSrc: Bladder  PainSc:          Complications: No apparent anesthesia complications

## 2017-12-30 NOTE — Plan of Care (Signed)
BP greater than ordered parameter of < 160, HR 120s. Dr. Janee Mornhompson paged. Verbal order received to increase fentanyl bolus dose to 100mcg.

## 2017-12-30 NOTE — Progress Notes (Signed)
RT NOTES: Patient returned for OR. Placed back on vent with ordered settings. RN at bedside.

## 2017-12-30 NOTE — Progress Notes (Signed)
   12/30/17 1600  Clinical Encounter Type  Visited With Family  Visit Type Follow-up   Responded to [facility] for use of [facility].  Met the mother of the patient and escorted her to met Jeanella Craze to facilitate use of the house.  She was very [employer].  Will follow and support as needed. Chaplain Katherene Ponto

## 2017-12-31 ENCOUNTER — Inpatient Hospital Stay: Payer: Self-pay

## 2017-12-31 ENCOUNTER — Encounter (HOSPITAL_COMMUNITY): Payer: Self-pay | Admitting: Orthopedic Surgery

## 2017-12-31 DIAGNOSIS — S82141C Displaced bicondylar fracture of right tibia, initial encounter for open fracture type IIIA, IIIB, or IIIC: Secondary | ICD-10-CM | POA: Diagnosis present

## 2017-12-31 DIAGNOSIS — S42351A Displaced comminuted fracture of shaft of humerus, right arm, initial encounter for closed fracture: Secondary | ICD-10-CM | POA: Diagnosis present

## 2017-12-31 DIAGNOSIS — S82401C Unspecified fracture of shaft of right fibula, initial encounter for open fracture type IIIA, IIIB, or IIIC: Secondary | ICD-10-CM

## 2017-12-31 DIAGNOSIS — S82201C Unspecified fracture of shaft of right tibia, initial encounter for open fracture type IIIA, IIIB, or IIIC: Secondary | ICD-10-CM

## 2017-12-31 HISTORY — DX: Displaced comminuted fracture of shaft of humerus, right arm, initial encounter for closed fracture: S42.351A

## 2017-12-31 HISTORY — DX: Unspecified fracture of shaft of right tibia, initial encounter for open fracture type IIIA, IIIB, or IIIC: S82.201C

## 2017-12-31 HISTORY — DX: Unspecified fracture of shaft of right fibula, initial encounter for open fracture type IIIA, IIIB, or IIIC: S82.401C

## 2017-12-31 LAB — GLUCOSE, CAPILLARY
GLUCOSE-CAPILLARY: 107 mg/dL — AB (ref 70–99)
GLUCOSE-CAPILLARY: 113 mg/dL — AB (ref 70–99)
GLUCOSE-CAPILLARY: 120 mg/dL — AB (ref 70–99)
Glucose-Capillary: 121 mg/dL — ABNORMAL HIGH (ref 70–99)
Glucose-Capillary: 128 mg/dL — ABNORMAL HIGH (ref 70–99)
Glucose-Capillary: 136 mg/dL — ABNORMAL HIGH (ref 70–99)

## 2017-12-31 LAB — BASIC METABOLIC PANEL
ANION GAP: 10 (ref 5–15)
BUN: 18 mg/dL (ref 6–20)
CALCIUM: 8 mg/dL — AB (ref 8.9–10.3)
CO2: 28 mmol/L (ref 22–32)
Chloride: 108 mmol/L (ref 98–111)
Creatinine, Ser: 0.62 mg/dL (ref 0.44–1.00)
GFR calc non Af Amer: >60 mL/min (ref >60)
Glucose, Bld: 127 mg/dL — ABNORMAL HIGH (ref 70–99)
POTASSIUM: 4.5 mmol/L (ref 3.5–5.1)
Sodium: 146 mmol/L — ABNORMAL HIGH (ref 135–145)

## 2017-12-31 LAB — CBC
HCT: 28.3 % — ABNORMAL LOW (ref 36.0–46.0)
Hemoglobin: 9 g/dL — ABNORMAL LOW (ref 12.0–15.0)
MCH: 29 pg (ref 26.0–34.0)
MCHC: 31.8 g/dL (ref 30.0–36.0)
MCV: 91.3 fL (ref 78.0–100.0)
PLATELETS: 132 10*3/uL — AB (ref 150–400)
RBC: 3.1 MIL/uL — AB (ref 3.87–5.11)
RDW: 14.5 % (ref 11.5–15.5)
WBC: 12.5 10*3/uL — AB (ref 4.0–10.5)

## 2017-12-31 MED ORDER — SODIUM CHLORIDE 0.9% FLUSH: 10.0000 mL | INTRAVENOUS | Status: DC | PRN

## 2017-12-31 MED ORDER — SODIUM CHLORIDE 0.9% FLUSH
10.0000 mL | Freq: Two times a day (BID) | INTRAVENOUS | Status: DC
  Administered 2018-01-02 – 2018-01-09 (×14): 10 mL

## 2017-12-31 MED ORDER — CHLORHEXIDINE GLUCONATE CLOTH 2 % EX PADS: 6.0000 | MEDICATED_PAD | Freq: Every day | CUTANEOUS | Status: DC

## 2017-12-31 MED ORDER — CHLORHEXIDINE GLUCONATE CLOTH 2 % EX PADS
6.0000 | MEDICATED_PAD | Freq: Every day | CUTANEOUS | Status: DC
  Administered 2018-01-02 – 2018-01-07 (×5): 6 via TOPICAL

## 2017-12-31 MED ORDER — SODIUM CHLORIDE 0.9 % IV SOLN
INTRAVENOUS | Status: DC | PRN
  Administered 2017-12-31: 250 mL via INTRAVENOUS
  Administered 2018-01-01: 08:00:00 via INTRAVENOUS
  Administered 2018-01-06: 500 mL via INTRAVENOUS

## 2017-12-31 MED ORDER — ACETAMINOPHEN 160 MG/5ML PO SOLN
650.0000 mg | Freq: Four times a day (QID) | ORAL | Status: DC | PRN
  Administered 2017-12-31 – 2018-01-02 (×2): 650 mg
  Filled 2017-12-31 (×2): qty 20.3
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
## 2017-12-31 NOTE — Progress Notes (Signed)
Spoke with Dewitt RotaStephanie Hinkle, Medical Case Manager with [employer]:  Updated case manager on patient's condition and pending surgery.  Faxed clinical information from 8/17 to present, per her request.    Dewitt RotaStephanie Hinkle, Medical Case Manager Cell: [phone] Fax:  [phone] Judeth Cornfield(Stephanie covering until 8/23)  As of 01/06/18,   Verneda SkillKathryn Alexander, RN, CRP, Medical Case Manager will be following pt. Phone:  [phone] Fax:  [phone]  Quintella BatonJulie W. Brenn Gatton, RN, BSN  Trauma/Neuro ICU Case Manager [phone]

## 2017-12-31 NOTE — Progress Notes (Signed)
Peripherally Inserted Central Catheter/Midline Placement  The IV Nurse has discussed with the patient and/or persons authorized to consent for the patient, the purpose of this procedure and the potential benefits and risks involved with this procedure.  The benefits include less needle sticks, lab draws from the catheter, and the patient may be discharged home with the catheter. Risks include, but not limited to, infection, bleeding, blood clot (thrombus formation), and puncture of an artery; nerve damage and irregular heartbeat and possibility to perform a PICC exchange if needed/ordered by physician.  Alternatives to this procedure were also discussed.  Bard Power PICC patient education guide, fact sheet on infection prevention and patient information card has been provided to patient /or left at bedside.    PICC/Midline Placement Documentation  PICC Triple Lumen 12/31/17 PICC Left Cephalic 47 cm 1 cm (Active)  Indication for Insertion or Continuance of Line Prolonged intravenous therapies 12/31/2017 10:57 AM  Exposed Catheter (cm) 1 cm 12/31/2017 10:57 AM  Site Assessment Clean;Dry;Intact 12/31/2017 10:57 AM  Lumen #1 Status Flushed;Saline locked;Blood return noted 12/31/2017 10:57 AM  Lumen #2 Status Flushed;Saline locked;Blood return noted 12/31/2017 10:57 AM  Lumen #3 Status Flushed;Saline locked;Blood return noted 12/31/2017 10:57 AM  Dressing Type Transparent;Securing device 12/31/2017 10:57 AM  Dressing Status Clean;Dry;Intact;Antimicrobial disc in place 12/31/2017 10:57 AM  Dressing Change Due 01/07/18 12/31/2017 10:57 AM       Romie JumperAlford, Pernie Grosso Terry 12/31/2017, 11:00 AM
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
## 2018-01-01 ENCOUNTER — Encounter (HOSPITAL_COMMUNITY): Payer: Self-pay | Admitting: Certified Registered Nurse Anesthetist

## 2018-01-01 ENCOUNTER — Inpatient Hospital Stay (HOSPITAL_COMMUNITY): Payer: No Typology Code available for payment source

## 2018-01-01 ENCOUNTER — Inpatient Hospital Stay (HOSPITAL_COMMUNITY): Payer: No Typology Code available for payment source | Admitting: Certified Registered Nurse Anesthetist

## 2018-01-01 ENCOUNTER — Encounter (HOSPITAL_COMMUNITY): Admission: EM | Disposition: A | Discharge status: Rehabilitation Facility | Payer: Self-pay | Source: Home / Self Care

## 2018-01-01 HISTORY — PX: I & D EXTREMITY: SHX5045

## 2018-01-01 HISTORY — PX: ORIF TIBIA PLATEAU: SHX2132

## 2018-01-01 LAB — BASIC METABOLIC PANEL
ANION GAP: 3 — AB (ref 5–15)
BUN: 17 mg/dL (ref 6–20)
CALCIUM: 7.3 mg/dL — AB (ref 8.9–10.3)
CHLORIDE: 112 mmol/L — AB (ref 98–111)
CO2: 29 mmol/L (ref 22–32)
Creatinine, Ser: 0.57 mg/dL (ref 0.44–1.00)
GFR calc non Af Amer: >60 mL/min (ref >60)
GLUCOSE: 104 mg/dL — AB (ref 70–99)
POTASSIUM: 4.7 mmol/L (ref 3.5–5.1)
Sodium: 144 mmol/L (ref 135–145)

## 2018-01-01 LAB — CBC
HEMATOCRIT: 26.7 % — AB (ref 36.0–46.0)
HEMOGLOBIN: 8.1 g/dL — AB (ref 12.0–15.0)
MCH: 29.2 pg (ref 26.0–34.0)
MCHC: 30.3 g/dL (ref 30.0–36.0)
MCV: 96.4 fL (ref 78.0–100.0)
Platelets: 146 10*3/uL — ABNORMAL LOW (ref 150–400)
RBC: 2.77 MIL/uL — ABNORMAL LOW (ref 3.87–5.11)
RDW: 14.3 % (ref 11.5–15.5)
WBC: 12.7 10*3/uL — AB (ref 4.0–10.5)

## 2018-01-01 LAB — POCT I-STAT 7, (LYTES, BLD GAS, ICA,H+H)
ACID-BASE EXCESS: 5 mmol/L — AB (ref 0.0–2.0)
ACID-BASE EXCESS: 5 mmol/L — AB (ref 0.0–2.0)
BICARBONATE: 29.7 mmol/L — AB (ref 20.0–28.0)
Bicarbonate: 30.2 mmol/L — ABNORMAL HIGH (ref 20.0–28.0)
CALCIUM ION: 1.11 mmol/L — AB (ref 1.15–1.40)
Calcium, Ion: 1.1 mmol/L — ABNORMAL LOW (ref 1.15–1.40)
HCT: 24 % — ABNORMAL LOW (ref 36.0–46.0)
HEMATOCRIT: 24 % — AB (ref 36.0–46.0)
HEMOGLOBIN: 8.2 g/dL — AB (ref 12.0–15.0)
Hemoglobin: 8.2 g/dL — ABNORMAL LOW (ref 12.0–15.0)
O2 SAT: 90 %
O2 SAT: 97 %
PCO2 ART: 46.9 mmHg (ref 32.0–48.0)
PH ART: 7.375 (ref 7.350–7.450)
PH ART: 7.41 (ref 7.350–7.450)
PO2 ART: 97 mmHg (ref 83.0–108.0)
POTASSIUM: 4.1 mmol/L (ref 3.5–5.1)
Patient temperature: 37.4
Patient temperature: 37.6
Potassium: 4.2 mmol/L (ref 3.5–5.1)
Sodium: 145 mmol/L (ref 135–145)
Sodium: 145 mmol/L (ref 135–145)
TCO2: 32 mmol/L (ref 22–32)
TCO2: 31 mmol/L (ref 22–32)
pCO2 arterial: 52 mmHg — ABNORMAL HIGH (ref 32.0–48.0)
pO2, Arterial: 63 mmHg — ABNORMAL LOW (ref 83.0–108.0)

## 2018-01-01 LAB — TYPE AND SCREEN
ABO/RH(D): O POS
ANTIBODY SCREEN: NEGATIVE

## 2018-01-01 LAB — GLUCOSE, CAPILLARY
GLUCOSE-CAPILLARY: 101 mg/dL — AB (ref 70–99)
GLUCOSE-CAPILLARY: 171 mg/dL — AB (ref 70–99)
Glucose-Capillary: 96 mg/dL (ref 70–99)
Glucose-Capillary: 102 mg/dL — ABNORMAL HIGH (ref 70–99)
Glucose-Capillary: 111 mg/dL — ABNORMAL HIGH (ref 70–99)

## 2018-01-01 SURGERY — IRRIGATION AND DEBRIDEMENT EXTREMITY
Anesthesia: General | Site: Knee | Laterality: Right

## 2018-01-01 MED ORDER — SODIUM CHLORIDE 0.9 % IV SOLN
INTRAVENOUS | Status: DC | PRN
  Administered 2018-01-01: 150 ug/h via INTRAVENOUS

## 2018-01-01 MED ORDER — MIDAZOLAM HCL 2 MG/2ML IJ SOLN
INTRAMUSCULAR | Status: AC
  Filled 2018-01-01: qty 2

## 2018-01-01 MED ORDER — MIDAZOLAM HCL 5 MG/5ML IJ SOLN
INTRAMUSCULAR | Status: DC | PRN
  Administered 2018-01-01: 2 mg via INTRAVENOUS

## 2018-01-01 MED ORDER — SODIUM CHLORIDE 0.9 % IV SOLN
INTRAVENOUS | Status: DC | PRN
  Administered 2018-01-01: 50 ug/min via INTRAVENOUS

## 2018-01-01 MED ORDER — DEXTROSE 5 % IV SOLN
3.0000 g | INTRAVENOUS | Status: DC
  Filled 2018-01-01: qty 3000

## 2018-01-01 MED ORDER — DEXAMETHASONE SODIUM PHOSPHATE 10 MG/ML IJ SOLN
INTRAMUSCULAR | Status: AC
  Filled 2018-01-01: qty 1

## 2018-01-01 MED ORDER — ROCURONIUM BROMIDE 50 MG/5ML IV SOSY
PREFILLED_SYRINGE | INTRAVENOUS | Status: DC | PRN
  Administered 2018-01-01: 30 mg via INTRAVENOUS
  Administered 2018-01-01 (×2): 50 mg via INTRAVENOUS

## 2018-01-01 MED ORDER — ONDANSETRON HCL 4 MG/2ML IJ SOLN
INTRAMUSCULAR | Status: AC
  Filled 2018-01-01: qty 2

## 2018-01-01 MED ORDER — ROCURONIUM BROMIDE 50 MG/5ML IV SOSY
PREFILLED_SYRINGE | INTRAVENOUS | Status: AC
  Filled 2018-01-01: qty 5

## 2018-01-01 MED ORDER — PROPOFOL 10 MG/ML IV BOLUS
INTRAVENOUS | Status: AC
  Filled 2018-01-01: qty 20

## 2018-01-01 MED ORDER — TOBRAMYCIN SULFATE 1.2 G IJ SOLR
INTRAMUSCULAR | Status: DC | PRN
  Administered 2018-01-01: 2.4 g via TOPICAL

## 2018-01-01 MED ORDER — SODIUM CHLORIDE 0.9 % IR SOLN
Status: DC | PRN
  Administered 2018-01-01 (×3): 3000 mL

## 2018-01-01 MED ORDER — DEXAMETHASONE SODIUM PHOSPHATE 10 MG/ML IJ SOLN
INTRAMUSCULAR | Status: DC | PRN
  Administered 2018-01-01: 4 mg via INTRAVENOUS

## 2018-01-01 MED ORDER — 0.9 % SODIUM CHLORIDE (POUR BTL) OPTIME
TOPICAL | Status: DC | PRN
  Administered 2018-01-01: 1000 mL

## 2018-01-01 MED ORDER — VANCOMYCIN HCL 1000 MG IV SOLR
INTRAVENOUS | Status: DC | PRN
  Administered 2018-01-01: 2000 mg via TOPICAL

## 2018-01-01 MED ORDER — VANCOMYCIN HCL 1000 MG IV SOLR
INTRAVENOUS | Status: AC
  Filled 2018-01-01: qty 2000

## 2018-01-01 MED ORDER — ONDANSETRON HCL 4 MG/2ML IJ SOLN
INTRAMUSCULAR | Status: DC | PRN
  Administered 2018-01-01: 4 mg via INTRAVENOUS

## 2018-01-01 MED ORDER — PHENYLEPHRINE 40 MCG/ML (10ML) SYRINGE FOR IV PUSH (FOR BLOOD PRESSURE SUPPORT)
PREFILLED_SYRINGE | INTRAVENOUS | Status: AC
  Filled 2018-01-01: qty 10

## 2018-01-01 MED ORDER — FENTANYL CITRATE (PF) 250 MCG/5ML IJ SOLN
INTRAMUSCULAR | Status: AC
  Filled 2018-01-01: qty 5

## 2018-01-01 MED ORDER — ROCURONIUM BROMIDE 50 MG/5ML IV SOSY
PREFILLED_SYRINGE | INTRAVENOUS | Status: AC
  Filled 2018-01-01: qty 10

## 2018-01-01 MED ORDER — TOBRAMYCIN SULFATE 1.2 G IJ SOLR
INTRAMUSCULAR | Status: AC
  Filled 2018-01-01: qty 2.4

## 2018-01-01 SURGICAL SUPPLY — 93 items
BANDAGE ACE 3X5.8 VEL STRL LF (GAUZE/BANDAGES/DRESSINGS) ×2 IMPLANT
BANDAGE ACE 4X5 VEL STRL LF (GAUZE/BANDAGES/DRESSINGS) ×4 IMPLANT
BANDAGE ACE 6X5 VEL STRL LF (GAUZE/BANDAGES/DRESSINGS) ×4 IMPLANT
BANDAGE ESMARK 6X9 LF (GAUZE/BANDAGES/DRESSINGS) ×2 IMPLANT
BIT DRILL CALIBR QC 2.8X250 (BIT) ×2 IMPLANT
BLADE CLIPPER SURG (BLADE) IMPLANT
BLADE SURG 15 STRL LF DISP TIS (BLADE) ×2 IMPLANT
BLADE SURG 15 STRL SS (BLADE) ×2
BNDG COHESIVE 4X5 TAN STRL (GAUZE/BANDAGES/DRESSINGS) ×2 IMPLANT
BNDG ESMARK 6X9 LF (GAUZE/BANDAGES/DRESSINGS) ×4
BNDG GAUZE ELAST 4 BULKY (GAUZE/BANDAGES/DRESSINGS) ×8 IMPLANT
BRUSH SCRUB SURG 4.25 DISP (MISCELLANEOUS) ×8 IMPLANT
CANISTER SUCT 3000ML PPV (MISCELLANEOUS) ×4 IMPLANT
CANISTER WOUND CARE 500ML ATS (WOUND CARE) ×2 IMPLANT
CHLORAPREP W/TINT 26ML (MISCELLANEOUS) ×8 IMPLANT
COVER MAYO STAND STRL (DRAPES) ×4 IMPLANT
COVER SURGICAL LIGHT HANDLE (MISCELLANEOUS) ×8 IMPLANT
CUFF TOURNIQUET SINGLE 34IN LL (TOURNIQUET CUFF) ×2 IMPLANT
DRAPE C-ARM 42X72 X-RAY (DRAPES) ×4 IMPLANT
DRAPE C-ARMOR (DRAPES) ×4 IMPLANT
DRAPE ORTHO SPLIT 77X108 STRL (DRAPES) ×4
DRAPE SURG 17X23 STRL (DRAPES) ×4 IMPLANT
DRAPE SURG ORHT 6 SPLT 77X108 (DRAPES) ×4 IMPLANT
DRAPE U-SHAPE 47X51 STRL (DRAPES) ×4 IMPLANT
DRSG ADAPTIC 3X8 NADH LF (GAUZE/BANDAGES/DRESSINGS) ×2 IMPLANT
DRSG MEPITEL 4X7.2 (GAUZE/BANDAGES/DRESSINGS) ×4 IMPLANT
DRSG PAD ABDOMINAL 8X10 ST (GAUZE/BANDAGES/DRESSINGS) ×4 IMPLANT
DRSG VAC ATS LRG SENSATRAC (GAUZE/BANDAGES/DRESSINGS) ×2 IMPLANT
ELECT REM PT RETURN 9FT ADLT (ELECTROSURGICAL) ×4
ELECTRODE REM PT RTRN 9FT ADLT (ELECTROSURGICAL) ×2 IMPLANT
EVACUATOR 1/8 PVC DRAIN (DRAIN) IMPLANT
GAUZE SPONGE 4X4 12PLY STRL (GAUZE/BANDAGES/DRESSINGS) ×2 IMPLANT
GAUZE XEROFORM 1X8 LF (GAUZE/BANDAGES/DRESSINGS) ×2 IMPLANT
GLOVE BIO SURGEON STRL SZ7.5 (GLOVE) ×16 IMPLANT
GLOVE BIOGEL PI IND STRL 7.5 (GLOVE) ×2 IMPLANT
GLOVE BIOGEL PI INDICATOR 7.5 (GLOVE) ×2
GOWN STRL REUS W/ TWL LRG LVL3 (GOWN DISPOSABLE) ×4 IMPLANT
GOWN STRL REUS W/TWL LRG LVL3 (GOWN DISPOSABLE) ×4
HANDPIECE INTERPULSE COAX TIP (DISPOSABLE)
IMMOBILIZER KNEE 22 UNIV (SOFTGOODS) ×2 IMPLANT
KIT BASIN OR (CUSTOM PROCEDURE TRAY) ×6 IMPLANT
KIT TURNOVER KIT B (KITS) ×4 IMPLANT
MANIFOLD NEPTUNE II (INSTRUMENTS) ×4 IMPLANT
NDL SUT 6 .5 CRC .975X.05 MAYO (NEEDLE) ×2 IMPLANT
NEEDLE MAYO TAPER (NEEDLE) ×2
NS IRRIG 1000ML POUR BTL (IV SOLUTION) ×4 IMPLANT
PACK ORTHO EXTREMITY (CUSTOM PROCEDURE TRAY) ×2 IMPLANT
PACK TOTAL JOINT (CUSTOM PROCEDURE TRAY) ×4 IMPLANT
PAD ARMBOARD 7.5X6 YLW CONV (MISCELLANEOUS) ×8 IMPLANT
PAD CAST 3X4 CTTN HI CHSV (CAST SUPPLIES) IMPLANT
PAD CAST 4YDX4 CTTN HI CHSV (CAST SUPPLIES) ×2 IMPLANT
PADDING CAST COTTON 3X4 STRL (CAST SUPPLIES) ×2
PADDING CAST COTTON 4X4 STRL (CAST SUPPLIES) ×2
PADDING CAST COTTON 6X4 STRL (CAST SUPPLIES) ×4 IMPLANT
PLATE LOCK VA-LCP 207 12H (Plate) ×2 IMPLANT
SCREW CORT HEADED ST 3.5X30 (Screw) ×4 IMPLANT
SCREW CORT HEADED ST 3.5X32 (Screw) ×2 IMPLANT
SCREW HEADED ST 3.5X20 (Screw) ×2 IMPLANT
SCREW HEADED ST 3.5X38 (Screw) ×2 IMPLANT
SCREW HEADED ST 3.5X75 (Screw) ×2 IMPLANT
SCREW LOCKING VA 3.5X30MM (Screw) ×2 IMPLANT
SCREW LOCKING VA 3.5X75MM (Screw) ×6 IMPLANT
SET HNDPC FAN SPRY TIP SCT (DISPOSABLE) IMPLANT
SPONGE LAP 18X18 X RAY DECT (DISPOSABLE) ×2 IMPLANT
STAPLER VISISTAT 35W (STAPLE) ×4 IMPLANT
SUCTION FRAZIER HANDLE 10FR (MISCELLANEOUS) ×2
SUCTION TUBE FRAZIER 10FR DISP (MISCELLANEOUS) ×2 IMPLANT
SUT ETHILON 2 0 FS 18 (SUTURE) ×4 IMPLANT
SUT ETHILON 3 0 FSL (SUTURE) ×8 IMPLANT
SUT ETHILON 3 0 PS 1 (SUTURE) ×4 IMPLANT
SUT FIBERWIRE #2 38 T-5 BLUE (SUTURE)
SUT MON AB 2-0 CT1 36 (SUTURE) ×10 IMPLANT
SUT PDS AB 0 CT 36 (SUTURE) ×2 IMPLANT
SUT PDS AB 2-0 CT1 27 (SUTURE) ×2 IMPLANT
SUT VIC AB 0 CT1 27 (SUTURE)
SUT VIC AB 0 CT1 27XBRD ANBCTR (SUTURE) IMPLANT
SUT VIC AB 1 CT1 18XCR BRD 8 (SUTURE) IMPLANT
SUT VIC AB 1 CT1 27 (SUTURE) ×2
SUT VIC AB 1 CT1 27XBRD ANBCTR (SUTURE) ×2 IMPLANT
SUT VIC AB 1 CT1 8-18 (SUTURE)
SUT VIC AB 2-0 CT1 27 (SUTURE) ×6
SUT VIC AB 2-0 CT1 TAPERPNT 27 (SUTURE) ×4 IMPLANT
SUTURE FIBERWR #2 38 T-5 BLUE (SUTURE) IMPLANT
SWAB CULTURE ESWAB REG 1ML (MISCELLANEOUS) IMPLANT
TIP HIGH FLOW IRRIGATION COAX (MISCELLANEOUS) ×2 IMPLANT
TOWEL OR 17X24 6PK STRL BLUE (TOWEL DISPOSABLE) ×4 IMPLANT
TOWEL OR 17X26 10 PK STRL BLUE (TOWEL DISPOSABLE) ×8 IMPLANT
TRAY FOLEY MTR SLVR 16FR STAT (SET/KITS/TRAYS/PACK) IMPLANT
TUBE CONNECTING 12'X1/4 (SUCTIONS) ×1
TUBE CONNECTING 12X1/4 (SUCTIONS) ×3 IMPLANT
UNDERPAD 30X30 (UNDERPADS AND DIAPERS) ×4 IMPLANT
WATER STERILE IRR 1000ML POUR (IV SOLUTION) ×8 IMPLANT
YANKAUER SUCT BULB TIP NO VENT (SUCTIONS) ×4 IMPLANT
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
## 2018-01-01 NOTE — Progress Notes (Signed)
Follow up - Trauma Critical Care  Patient Details:    Dorothy Wilson is an 47 y.o. female.  Lines/tubes : Airway 7.5 mm (Active)  Secured at (cm) 24 cm 01/01/2018  3:16 AM  Measured From Lips 01/01/2018  3:16 AM  Secured Location Center 01/01/2018  3:16 AM  Secured By [employer] Tube Holder 01/01/2018  3:16 AM  Tube Holder Repositioned Yes 01/01/2018  3:16 AM  Cuff Pressure (cm H2O) 25 cm H2O 12/31/2017  7:23 PM  Site Condition Dry 01/01/2018  3:16 AM     PICC Triple Lumen 12/31/17 PICC Left Cephalic 47 cm 1 cm (Active)  Indication for Insertion or Continuance of Line Prolonged intravenous therapies;Limited venous access - need for IV therapy >5 days (PICC only) 01/01/2018  8:00 AM  Exposed Catheter (cm) 1 cm 12/31/2017 10:57 AM  Site Assessment Clean;Dry;Intact 01/01/2018  8:00 AM  Lumen #1 Status Infusing 01/01/2018  8:00 AM  Lumen #2 Status Infusing 01/01/2018  8:00 AM  Lumen #3 Status In-line blood sampling system in place;No blood return 01/01/2018  8:00 AM  Dressing Type Transparent;Securing device 01/01/2018  8:00 AM  Dressing Status Clean;Dry;Intact;Antimicrobial disc in place 01/01/2018  8:00 AM  Line Care Connections checked and tightened 01/01/2018  8:00 AM  Dressing Change Due 01/07/18 01/01/2018  8:00 AM     Arterial Line 01/01/18 Radial (Active)     Negative Pressure Wound Therapy Knee Anterior;Right (Active)  Site / Wound Assessment Dressing in place / Unable to assess 01/01/2018  8:00 AM  Peri-wound Assessment Other (Comment) 12/29/2017  8:00 AM  Cycle Continuous 01/01/2018  8:00 AM  Target Pressure (mmHg) 125 01/01/2018  8:00 AM  Canister Changed No 01/01/2018  8:00 AM  Dressing Status Intact 01/01/2018  8:00 AM  Drainage Amount Moderate 01/01/2018  8:00 AM  Drainage Description Serosanguineous 01/01/2018  8:00 AM  Output (mL) 100 mL 01/01/2018  6:00 AM     NG/OG Tube Orogastric 18 Fr. Center mouth Xray (Active)  External Length of Tube (cm) - (if applicable) 52 cm 01/01/2018   8:00 AM  Site Assessment Clean;Dry;Intact 01/01/2018  8:00 AM  Ongoing Placement Verification No change in cm markings or external length of tube from initial placement;No change in respiratory status;No acute changes, not attributed to clinical condition 01/01/2018  8:00 AM  Status Clamped 01/01/2018  8:00 AM  Amount of suction 110 mmHg 12/28/2017  8:00 AM  Drainage Appearance Bile;Brown 12/28/2017  8:00 AM  Intake (mL) 150 mL 12/30/2017 11:00 AM  Output (mL) 400 mL 12/28/2017  6:30 PM     Urethral Catheter Pennie Banterallie Straughn, RN  Latex;Straight-tip;Temperature probe 14 Fr. (Active)  Indication for Insertion or Continuance of Catheter Peri-operative use for selective surgical procedure;Other (comment) 01/01/2018  8:00 AM  Site Assessment Clean;Intact 01/01/2018  8:00 AM  Catheter Maintenance Bag below level of bladder;Catheter secured;Drainage bag/tubing not touching floor;Seal intact;No dependent loops;Insertion date on drainage bag;Bag emptied prior to transport 01/01/2018  8:00 AM  Collection Container Standard drainage bag 01/01/2018  8:00 AM  Securement Method Securing device (Describe) 01/01/2018  8:00 AM  Urinary Catheter Interventions Unclamped 01/01/2018  8:00 AM  Output (mL) 142 mL 01/01/2018  6:00 AM    Microbiology/Sepsis markers: Results for orders placed or performed during the hospital encounter of 12/25/17  Urine culture     Status: None   Collection Time: 12/25/17 10:06 PM  Result Value Ref Range Status   Specimen Description URINE, CATHETERIZED  Final   Special Requests Normal  Final  Culture   Final    NO GROWTH Performed at [facility], [address] [place], [place] [address]    Report Status 12/27/2017 FINAL  Final  MRSA PCR Screening     Status: None   Collection Time: 12/25/17 11:37 PM  Result Value Ref Range Status   MRSA by PCR NEGATIVE NEGATIVE Final    Comment:        The GeneXpert MRSA Assay (FDA approved for NASAL specimens only), is one component of  a comprehensive MRSA colonization surveillance program. It is not intended to diagnose MRSA infection nor to guide or monitor treatment for MRSA infections. Performed at [facility], [address] [place], [place] [address]   Surgical PCR screen     Status: None   Collection Time: 12/26/17 10:56 AM  Result Value Ref Range Status   MRSA, PCR NEGATIVE NEGATIVE Final   Staphylococcus aureus NEGATIVE NEGATIVE Final    Comment: (NOTE) The Xpert SA Assay (FDA approved for NASAL specimens in patients 49 years of age and older), is one component of a comprehensive surveillance program. It is not intended to diagnose infection nor to guide or monitor treatment. Performed at [facility], [address] [place], [place] [address]     Anti-infectives:  Anti-infectives (From admission, onward)   Start     Dose/Rate Route Frequency Ordered Stop   01/01/18 1029  tobramycin (NEBCIN) powder       As needed 01/01/18 1029     01/01/18 1029  vancomycin (VANCOCIN) powder       As needed 01/01/18 1029     01/01/18 0915  ceFAZolin (ANCEF) 3 g in dextrose 5 % 50 mL IVPB     3 g 160 mL/hr over 30 Minutes Intravenous To Surgery 01/01/18 0908 01/02/18 0915   12/30/17 0929  tobramycin (NEBCIN) powder  Status:  Discontinued       As needed 12/30/17 0929 12/30/17 1057   12/30/17 0929  vancomycin (VANCOCIN) powder  Status:  Discontinued       As needed 12/30/17 0929 12/30/17 1057   12/30/17 0900  ceFAZolin (ANCEF) IVPB 2 g/50 mL premix     2 g 100 mL/hr over 30 Minutes Intravenous  Once 12/30/17 0851 12/30/17 0858   12/27/17 1056  vancomycin (VANCOCIN) powder  Status:  Discontinued       As needed 12/27/17 1056 12/27/17 1212   12/27/17 1000  [MAR Hold]  ceFAZolin (ANCEF) IVPB 1 g/50 mL premix     (MAR Hold since Wed 01/01/2018 at 0854. Reason: Transfer to a Procedural area.)   1 g 100 mL/hr over 30 Minutes Intravenous  Once 12/27/17 1000 01/01/18 0909   12/26/17 2000  [MAR Hold]   cefTRIAXone (ROCEPHIN) 2 g in sodium chloride 0.9 % 100 mL IVPB     (MAR Hold since Wed 01/01/2018 at 0854. Reason: Transfer to a Procedural area.)   2 g 200 mL/hr over 30 Minutes Intravenous Every 24 hours 12/26/17 1857     12/26/17 1540  vancomycin (VANCOCIN) powder  Status:  Discontinued       As needed 12/26/17 1542 12/26/17 1656   12/26/17 1539  tobramycin (NEBCIN) powder  Status:  Discontinued       As needed 12/26/17 1539 12/26/17 1656   12/26/17 1200  ceFAZolin (ANCEF) IVPB 2g/100 mL premix  Status:  Discontinued     2 g 200 mL/hr over 30 Minutes Intravenous Every 8 hours 12/26/17 0640 12/26/17  1857   12/26/17 0400  ceFAZolin (ANCEF) IVPB 1 g/50 mL premix  Status:  Discontinued     1 g 100 mL/hr over 30 Minutes Intravenous Every 8 hours 12/25/17 2140 12/26/17 0640   12/25/17 1907  ceFAZolin (ANCEF) 2-4 GM/100ML-% IVPB    Note to Pharmacy:  Lysle Pearl   : cabinet override      12/25/17 1907 12/25/17 2030      Best Practice/Protocols:    Consults: Treatment Team:  Roby Lofts, MD   Subjective:    Overnight Issues:   Objective:  Vital signs for last 24 hours: Temp:  [99.9 F (37.7 C)-101.5 F (38.6 C)] 100.8 F (38.2 C) (08/21 0830) Pulse Rate:  [72-107] 89 (08/21 0830) Resp:  [16-22] 17 (08/21 0830) BP: (118-167)/(52-92) 150/68 (08/21 0830) SpO2:  [91 %-100 %] 98 % (08/21 0830) FiO2 (%):  [30 %] 30 % (08/21 0700) Weight:  [134.8 kg] 134.8 kg (08/21 0200)  Hemodynamic parameters for last 24 hours:    Intake/Output from previous day: 08/20 0701 - 08/21 0700 In: 2591.2 [I.V.:1789.8; NG/GT:701.3; IV Piggyback:100.1] Out: 2097 [ZOXWR:6045; Drains:200]  Intake/Output this shift: Total I/O In: 565.9 [I.V.:565.9] Out: 500 [Urine:250; Blood:250]  Vent settings for last 24 hours: Vent Mode: PRVC FiO2 (%):  [30 %] 30 % Set Rate:  [16 bmp] 16 bmp Vt Set:  [550 mL] 550 mL PEEP:  [5 cmH20] 5 cmH20 Plateau Pressure:  [20 cmH20-25 cmH20] 22  cmH20  Physical Exam:  In OR with Dr. Gerarda Fraction Gen anesthesia  Results for orders placed or performed during the hospital encounter of 12/25/17 (from the past 24 hour(s))  Glucose, capillary     Status: Abnormal   Collection Time: 12/31/17 11:44 AM  Result Value Ref Range   Glucose-Capillary 136 (H) 70 - 99 mg/dL   Comment 1 Notify RN    Comment 2 Document in Chart   Glucose, capillary     Status: Abnormal   Collection Time: 12/31/17  3:24 PM  Result Value Ref Range   Glucose-Capillary 120 (H) 70 - 99 mg/dL   Comment 1 Notify RN    Comment 2 Document in Chart   Glucose, capillary     Status: Abnormal   Collection Time: 12/31/17  8:05 PM  Result Value Ref Range   Glucose-Capillary 107 (H) 70 - 99 mg/dL  Glucose, capillary     Status: Abnormal   Collection Time: 12/31/17 11:33 PM  Result Value Ref Range   Glucose-Capillary 113 (H) 70 - 99 mg/dL  Glucose, capillary     Status: None   Collection Time: 01/01/18  4:13 AM  Result Value Ref Range   Glucose-Capillary 96 70 - 99 mg/dL  CBC     Status: Abnormal   Collection Time: 01/01/18  4:56 AM  Result Value Ref Range   WBC 12.7 (H) 4.0 - 10.5 K/uL   RBC 2.77 (L) 3.87 - 5.11 MIL/uL   Hemoglobin 8.1 (L) 12.0 - 15.0 g/dL   HCT 40.9 (L) 81.1 - 91.4 %   MCV 96.4 78.0 - 100.0 fL   MCH 29.2 26.0 - 34.0 pg   MCHC 30.3 30.0 - 36.0 g/dL   RDW 78.2 95.6 - 21.3 %   Platelets 146 (L) 150 - 400 K/uL  Basic metabolic panel     Status: Abnormal   Collection Time: 01/01/18  4:56 AM  Result Value Ref Range   Sodium 144 135 - 145 mmol/L   Potassium 4.7 3.5 -  5.1 mmol/L   Chloride 112 (H) 98 - 111 mmol/L   CO2 29 22 - 32 mmol/L   Glucose, Bld 104 (H) 70 - 99 mg/dL   BUN 17 6 - 20 mg/dL   Creatinine, Ser 0.98 0.44 - 1.00 mg/dL   Calcium 7.3 (L) 8.9 - 10.3 mg/dL   GFR calc non Af Amer >60 >60 mL/min   GFR calc Af Amer >60 >60 mL/min   Anion gap 3 (L) 5 - 15  Glucose, capillary     Status: Abnormal   Collection Time: 01/01/18  7:26  AM  Result Value Ref Range   Glucose-Capillary 102 (H) 70 - 99 mg/dL   Comment 1 Notify RN    Comment 2 Document in Chart   Type and screen [facility]     Status: None   Collection Time: 01/01/18  7:38 AM  Result Value Ref Range   ABO/RH(D) O POS    Antibody Screen NEG    Sample Expiration      01/04/2018 Performed at [facility], [address] [place], [place] [address]     Assessment & Plan: Present on Admission: . Liver laceration, closed, initial encounter . Open fracture of right tibia and fibula, type III, initial encounter . Closed displaced comminuted fracture of shaft of right humerus    LOS: 7 days   Additional comments:. 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 Franky Macho ABL anemia  Thrombocytopenia - consumptive, improved Open R tib fib - washed out in ED. S/P Ex fix by Dr. Jena Gauss 8/15, S/P I&D and ABX bead placement 8/19 by Dr. Carola Frost R humerus FX - S/P ORIF by Dr. Carola Frost R second distal phalanx FX - per Dr. Jena Gauss L 2-4 MC FXs - S/P ORIF/CR by Dr. Jena Gauss 8/16 L L2-3 TVP FXs FEN - TF, Klon/Sero VTE - Lovenox Dispo - ICU Critical Care Total Time*: 32 Minutes  Violeta Gelinas, MD, MPH, FACS Trauma: [phone] General Surgery: [phone]  01/01/2018  *Care during the described time interval was provided by me. I have reviewed this patient's available data, including medical history, events of note, physical examination and test results as part of my evaluation.  Patient ID: Dorothy Wilson, female   DOB: [birth year], 47 y.o.   MRN: [id number]

## 2018-01-01 NOTE — Transfer of Care (Signed)
Immediate Anesthesia Transfer of Care Note  Patient: Dorothy Wilson  Procedure(s) Performed: IRRIGATION AND DEBRIDEMENT EXTREMITY (Right ) OPEN REDUCTION INTERNAL FIXATION (ORIF) TIBIAL PLATEAU (Right Knee)  Patient Location: NICU  Anesthesia Type:General  Level of Consciousness: Patient remains intubated per anesthesia plan  Airway & Oxygen Therapy: Patient remains intubated per anesthesia plan and Patient placed on Ventilator (see vital sign flow sheet for setting)  Post-op Assessment: Report given to RN and Post -op Vital signs reviewed and stable  Post vital signs: Reviewed and stable  Last Vitals:  Vitals Value Taken Time  BP    Temp 37.3 C 01/01/2018 12:01 PM  Pulse 100 01/01/2018 12:01 PM  Resp 16 01/01/2018 12:01 PM  SpO2 96 % 01/01/2018 12:01 PM  Vitals shown include unvalidated device data.  Last Pain:  Vitals:   01/01/18 0400  TempSrc: Bladder  PainSc:          Complications: No apparent anesthesia complications

## 2018-01-01 NOTE — Interval H&P Note (Signed)
History and Physical Interval Note:  01/01/2018 8:30 AM  Dorothy Wilson  has presented today for surgery, with the diagnosis of open right proximal tibia and fibula fracture  The various methods of treatment have been discussed with the patient and family. After consideration of risks, benefits and other options for treatment, the patient has consented to  Procedure(s): IRRIGATION AND DEBRIDEMENT EXTREMITY (Right) POSSIBLE OPEN REDUCTION INTERNAL FIXATION (ORIF) TIBIAL PLATEAU (Right) as a surgical intervention .  The patient's history has been reviewed, patient examined, no change in status, stable for surgery.  I have reviewed the patient's chart and labs.  Questions were answered to the patient's satisfaction.     Caryn BeeKevin P Tasean Mancha
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
## 2018-01-01 NOTE — Anesthesia Procedure Notes (Signed)
Arterial Line Insertion Start/End8/21/2019 9:00 AM, 01/01/2018 9:03 AM Performed by: Jed LimerickHarder, Blaire S, CRNA, CRNA  Preanesthetic checklist: patient identified, IV checked, site marked, risks and benefits discussed, surgical consent, monitors and equipment checked, pre-op evaluation, timeout performed and anesthesia consent Patient sedated Right, radial was placed Catheter size: 20 G Hand hygiene performed  and maximum sterile barriers used  Allen's test indicative of satisfactory collateral circulation Attempts: 1 Procedure performed without using ultrasound guided technique. Ultrasound Notes:anatomy identified Following insertion, dressing applied and Biopatch. Post procedure assessment: normal  Patient tolerated the procedure well with no immediate complications.

## 2018-01-01 NOTE — Anesthesia Procedure Notes (Signed)
Date/Time: 01/01/2018 8:42 AM Performed by: Epifanio LeschesMercer, Jarissa Sheriff L, CRNA Pre-anesthesia Checklist: Patient identified, Emergency Drugs available, Suction available and Patient being monitored Patient Re-evaluated:Patient Re-evaluated prior to induction Oxygen Delivery Method: Circle System Utilized Preoxygenation: Pre-oxygenation with 100% oxygen Induction Type: Inhalational induction with existing ETT Tube type: Subglottic suction tube Tube size: 7.5 mm Placement Confirmation: positive ETCO2 and breath sounds checked- equal and bilateral Secured at: 23 cm Dental Injury: Teeth and Oropharynx as per pre-operative assessment  Comments: Oral ETT in situ. Hand ventilated to OR with ambu bag 100% fio2. Inhalation induction.
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
## 2018-01-01 NOTE — Progress Notes (Signed)
[facility], per her request.  Quintella BatonJulie W. Mrk Buzby, RN, BSN  Trauma/Neuro ICU Case Manager [phone]

## 2018-01-02 ENCOUNTER — Inpatient Hospital Stay (HOSPITAL_COMMUNITY): Payer: No Typology Code available for payment source

## 2018-01-02 DIAGNOSIS — S62315A Displaced fracture of base of fourth metacarpal bone, left hand, initial encounter for closed fracture: Secondary | ICD-10-CM

## 2018-01-02 DIAGNOSIS — S62333A Displaced fracture of neck of third metacarpal bone, left hand, initial encounter for closed fracture: Secondary | ICD-10-CM

## 2018-01-02 DIAGNOSIS — S62331A Displaced fracture of neck of second metacarpal bone, left hand, initial encounter for closed fracture: Secondary | ICD-10-CM

## 2018-01-02 DIAGNOSIS — S27329A Contusion of lung, unspecified, initial encounter: Secondary | ICD-10-CM

## 2018-01-02 DIAGNOSIS — S2249XA Multiple fractures of ribs, unspecified side, initial encounter for closed fracture: Secondary | ICD-10-CM

## 2018-01-02 DIAGNOSIS — S81809A Unspecified open wound, unspecified lower leg, initial encounter: Secondary | ICD-10-CM

## 2018-01-02 LAB — BASIC METABOLIC PANEL
ANION GAP: 6 (ref 5–15)
BUN: 21 mg/dL — ABNORMAL HIGH (ref 6–20)
CALCIUM: 7.7 mg/dL — AB (ref 8.9–10.3)
CO2: 30 mmol/L (ref 22–32)
Chloride: 109 mmol/L (ref 98–111)
Creatinine, Ser: 0.77 mg/dL (ref 0.44–1.00)
GFR calc Af Amer: >60 mL/min (ref >60)
GLUCOSE: 115 mg/dL — AB (ref 70–99)
Potassium: 4.1 mmol/L (ref 3.5–5.1)
Sodium: 145 mmol/L (ref 135–145)

## 2018-01-02 LAB — CBC
HCT: 24.7 % — ABNORMAL LOW (ref 36.0–46.0)
Hemoglobin: 7.6 g/dL — ABNORMAL LOW (ref 12.0–15.0)
MCH: 29.1 pg (ref 26.0–34.0)
MCHC: 30.8 g/dL (ref 30.0–36.0)
MCV: 94.6 fL (ref 78.0–100.0)
PLATELETS: 198 10*3/uL (ref 150–400)
RBC: 2.61 MIL/uL — ABNORMAL LOW (ref 3.87–5.11)
RDW: 14.2 % (ref 11.5–15.5)
WBC: 13.2 10*3/uL — AB (ref 4.0–10.5)

## 2018-01-02 LAB — GLUCOSE, CAPILLARY
GLUCOSE-CAPILLARY: 109 mg/dL — AB (ref 70–99)
GLUCOSE-CAPILLARY: 127 mg/dL — AB (ref 70–99)
Glucose-Capillary: 121 mg/dL — ABNORMAL HIGH (ref 70–99)
Glucose-Capillary: 124 mg/dL — ABNORMAL HIGH (ref 70–99)
Glucose-Capillary: 146 mg/dL — ABNORMAL HIGH (ref 70–99)
Glucose-Capillary: 83 mg/dL (ref 70–99)

## 2018-01-02 MED ORDER — DOCUSATE SODIUM 50 MG/5ML PO LIQD: 100.0000 mg | Freq: Two times a day (BID) | ORAL | Status: DC | PRN

## 2018-01-02 MED ORDER — IPRATROPIUM-ALBUTEROL 0.5-2.5 (3) MG/3ML IN SOLN
3.0000 mL | Freq: Four times a day (QID) | RESPIRATORY_TRACT | Status: DC
  Administered 2018-01-02 – 2018-01-05 (×13): 3 mL via RESPIRATORY_TRACT
  Filled 2018-01-02 (×13): qty 3

## 2018-01-02 MED ORDER — LORAZEPAM 2 MG/ML IJ SOLN
1.0000 mg | INTRAMUSCULAR | Status: DC | PRN
  Administered 2018-01-02 – 2018-01-06 (×7): 1 mg via INTRAVENOUS
  Filled 2018-01-02 (×9): qty 1

## 2018-01-02 MED ORDER — RACEPINEPHRINE HCL 2.25 % IN NEBU
0.5000 mL | INHALATION_SOLUTION | RESPIRATORY_TRACT | Status: DC | PRN
  Administered 2018-01-02 – 2018-01-03 (×3): 0.5 mL via RESPIRATORY_TRACT
  Filled 2018-01-02 (×3): qty 0.5

## 2018-01-02 MED ORDER — OXYCODONE HCL 5 MG PO TABS
5.0000 mg | ORAL_TABLET | ORAL | Status: DC | PRN
  Administered 2018-01-03 – 2018-01-06 (×10): 10 mg via ORAL
  Administered 2018-01-08: 5 mg via ORAL
  Administered 2018-01-09: 10 mg via ORAL
  Administered 2018-01-09 – 2018-01-10 (×2): 5 mg via ORAL
  Administered 2018-01-10 (×2): 10 mg via ORAL
  Filled 2018-01-02 (×2): qty 1
  Filled 2018-01-02 (×8): qty 2
  Filled 2018-01-02: qty 1
  Filled 2018-01-02 (×6): qty 2

## 2018-01-02 MED ORDER — ORAL CARE MOUTH RINSE
15.0000 mL | Freq: Two times a day (BID) | OROMUCOSAL | Status: DC
  Administered 2018-01-02 – 2018-01-09 (×14): 15 mL via OROMUCOSAL

## 2018-01-02 MED ORDER — VANCOMYCIN HCL IN DEXTROSE 1-5 GM/200ML-% IV SOLN
1000.0000 mg | Freq: Three times a day (TID) | INTRAVENOUS | Status: DC
  Administered 2018-01-02 – 2018-01-07 (×14): 1000 mg via INTRAVENOUS
  Filled 2018-01-02 (×17): qty 200

## 2018-01-02 MED ORDER — RACEPINEPHRINE HCL 2.25 % IN NEBU: 0.5000 mL | INHALATION_SOLUTION | Freq: Once | RESPIRATORY_TRACT | Status: DC

## 2018-01-02 MED ORDER — FENTANYL CITRATE (PF) 100 MCG/2ML IJ SOLN
50.0000 ug | INTRAMUSCULAR | Status: DC | PRN
  Administered 2018-01-02 – 2018-01-03 (×9): 100 ug via INTRAVENOUS
  Administered 2018-01-03: 50 ug via INTRAVENOUS
  Administered 2018-01-03 – 2018-01-06 (×12): 100 ug via INTRAVENOUS
  Administered 2018-01-07: 50 ug via INTRAVENOUS
  Filled 2018-01-02 (×24): qty 2

## 2018-01-02 MED ORDER — RACEPINEPHRINE HCL 2.25 % IN NEBU
INHALATION_SOLUTION | RESPIRATORY_TRACT | Status: AC
  Filled 2018-01-02: qty 0.5

## 2018-01-02 MED ORDER — METOPROLOL TARTRATE 25 MG/10 ML ORAL SUSPENSION
25.0000 mg | Freq: Two times a day (BID) | ORAL | Status: DC
  Filled 2018-01-02: qty 10

## 2018-01-02 MED ORDER — PIPERACILLIN-TAZOBACTAM 3.375 G IVPB
3.3750 g | Freq: Three times a day (TID) | INTRAVENOUS | Status: DC
  Administered 2018-01-02 – 2018-01-07 (×14): 3.375 g via INTRAVENOUS
  Filled 2018-01-02 (×17): qty 50

## 2018-01-02 MED ORDER — ACETAMINOPHEN 160 MG/5ML PO SOLN
650.0000 mg | Freq: Four times a day (QID) | ORAL | Status: DC | PRN
  Administered 2018-01-08: 650 mg via ORAL
  Filled 2018-01-02: qty 20.3

## 2018-01-02 MED ORDER — RACEPINEPHRINE HCL 2.25 % IN NEBU
0.5000 mL | INHALATION_SOLUTION | Freq: Once | RESPIRATORY_TRACT | Status: AC
  Administered 2018-01-02: 0.5 mL via RESPIRATORY_TRACT

## 2018-01-02 MED ORDER — WHITE PETROLATUM EX OINT
TOPICAL_OINTMENT | CUTANEOUS | Status: DC | PRN
  Administered 2018-01-02: 0.2 via TOPICAL
  Administered 2018-01-08: 11:00:00 via TOPICAL
  Filled 2018-01-02 (×2): qty 28.35

## 2018-01-02 MED ORDER — LORAZEPAM 2 MG/ML IJ SOLN
1.0000 mg | INTRAMUSCULAR | Status: DC | PRN
  Administered 2018-01-02 (×2): 1 mg via INTRAVENOUS
  Filled 2018-01-02 (×2): qty 1

## 2018-01-02 NOTE — Progress Notes (Addendum)
Patient ID: Dorothy Wilson, female   DOB: [birth year], 47 y.o.   MRN: [id number] Follow up - Trauma Critical Care  Patient Details:    Dorothy Wilson is an 47 y.o. female.  Lines/tubes : Airway 7.5 mm (Active)  Secured at (cm) 24 cm 01/02/2018  7:23 AM  Measured From Lips 01/02/2018  7:23 AM  Secured Location Left 01/02/2018  7:23 AM  Secured By [employer] 01/02/2018  7:23 AM  Tube Holder Repositioned Yes 01/02/2018  7:23 AM  Cuff Pressure (cm H2O) 28 cm H2O 01/01/2018  8:25 PM  Site Condition Dry 01/02/2018  7:23 AM     PICC Triple Lumen 12/31/17 PICC Left Cephalic 47 cm 1 cm (Active)  Indication for Insertion or Continuance of Line Prolonged intravenous therapies 01/02/2018  8:00 AM  Exposed Catheter (cm) 1 cm 12/31/2017 10:57 AM  Site Assessment Clean;Dry;Intact 01/01/2018  8:00 PM  Lumen #1 Status Infusing 01/01/2018  8:00 PM  Lumen #2 Status Infusing 01/01/2018  8:00 PM  Lumen #3 Status In-line blood sampling system in place;No blood return 01/01/2018  8:00 PM  Dressing Type Transparent;Securing device 01/01/2018  8:00 PM  Dressing Status Clean;Dry;Intact;Antimicrobial disc in place 01/01/2018  8:00 PM  Line Care Connections checked and tightened 01/01/2018  8:00 PM  Dressing Change Due 01/07/18 01/01/2018  8:00 PM     Arterial Line 01/01/18 Radial (Active)  Site Assessment Clean;Dry;Intact 01/01/2018  8:00 PM  Line Status Pulsatile blood flow 01/01/2018  8:00 PM  Art Line Waveform Whip 01/01/2018  8:00 PM  Art Line Interventions Zeroed and calibrated;Connections checked and tightened 01/01/2018  8:00 PM  Color/Movement/Sensation Capillary refill less than 3 sec 01/01/2018  8:00 PM  Dressing Type Transparent 01/01/2018  8:00 PM  Dressing Status Clean;Dry;Intact;Antimicrobial disc in place 01/01/2018  8:00 PM  Dressing Change Due 01/08/18 01/01/2018  8:00 PM     Negative Pressure Wound Therapy Knee Anterior;Right (Active)  Site / Wound Assessment Dressing in place /  Unable to assess 01/01/2018  8:00 PM  Peri-wound Assessment Other (Comment) 12/29/2017  8:00 AM  Cycle Continuous 01/01/2018  8:00 PM  Target Pressure (mmHg) 125 01/01/2018  8:00 PM  Canister Changed No 01/01/2018  8:00 AM  Dressing Status Intact 01/01/2018  8:00 AM  Drainage Amount Scant 01/01/2018  8:00 PM  Drainage Description Serosanguineous 01/01/2018  8:00 AM  Output (mL) 0 mL 01/01/2018  6:00 PM     NG/OG Tube Orogastric 18 Fr. Center mouth Xray (Active)  External Length of Tube (cm) - (if applicable) 52 cm 01/01/2018  8:00 AM  Site Assessment Clean;Dry;Intact 01/01/2018  8:00 PM  Ongoing Placement Verification No change in cm markings or external length of tube from initial placement;No change in respiratory status;No acute changes, not attributed to clinical condition 01/01/2018  8:00 PM  Status Infusing tube feed 01/01/2018  8:00 PM  Amount of suction 110 mmHg 12/28/2017  8:00 AM  Drainage Appearance Bile;Brown 12/28/2017  8:00 AM  Intake (mL) 150 mL 12/30/2017 11:00 AM  Output (mL) 400 mL 12/28/2017  6:30 PM     Urethral Catheter Pennie Banter, RN  Latex;Straight-tip;Temperature probe 14 Fr. (Active)  Indication for Insertion or Continuance of Catheter Peri-operative use for selective surgical procedure;Other (comment) 01/02/2018  8:00 AM  Site Assessment Clean;Intact 01/01/2018  8:00 PM  Catheter Maintenance Bag below level of bladder;Catheter secured;Drainage bag/tubing not touching floor;No dependent loops;Insertion date on drainage bag;Seal intact 01/02/2018  8:00 AM  Collection Container Standard drainage bag 01/01/2018  8:00 PM  Securement Method Securing device (Describe) 01/01/2018  8:00 PM  Urinary Catheter Interventions Unclamped 01/01/2018  8:00 PM  Output (mL) 250 mL 01/02/2018  8:00 AM    Microbiology/Sepsis markers: Results for orders placed or performed during the hospital encounter of 12/25/17  Urine culture     Status: None   Collection Time: 12/25/17 10:06 PM  Result Value  Ref Range Status   Specimen Description URINE, CATHETERIZED  Final   Special Requests Normal  Final   Culture   Final    NO GROWTH Performed at [facility], [address] [place], [place] [address]    Report Status 12/27/2017 FINAL  Final  MRSA PCR Screening     Status: None   Collection Time: 12/25/17 11:37 PM  Result Value Ref Range Status   MRSA by PCR NEGATIVE NEGATIVE Final    Comment:        The GeneXpert MRSA Assay (FDA approved for NASAL specimens only), is one component of a comprehensive MRSA colonization surveillance program. It is not intended to diagnose MRSA infection nor to guide or monitor treatment for MRSA infections. Performed at [facility], [address], [place], [place] [address]   Surgical PCR screen     Status: None   Collection Time: 12/26/17 10:56 AM  Result Value Ref Range Status   MRSA, PCR NEGATIVE NEGATIVE Final   Staphylococcus aureus NEGATIVE NEGATIVE Final    Comment: (NOTE) The Xpert SA Assay (FDA approved for NASAL specimens in patients 55 years of age and older), is one component of a comprehensive surveillance program. It is not intended to diagnose infection nor to guide or monitor treatment. Performed at [facility], [address], [place], [place] [address]     Anti-infectives:  Anti-infectives (From admission, onward)   Start     Dose/Rate Route Frequency Ordered Stop   01/01/18 1029  tobramycin (NEBCIN) powder  Status:  Discontinued       As needed 01/01/18 1029 01/01/18 1139   01/01/18 1029  vancomycin (VANCOCIN) powder  Status:  Discontinued       As needed 01/01/18 1029 01/01/18 1139   01/01/18 0915  ceFAZolin (ANCEF) 3 g in dextrose 5 % 50 mL IVPB  Status:  Discontinued     3 g 160 mL/hr over 30 Minutes Intravenous To Surgery 01/01/18 0908 01/01/18 1155   12/30/17 0929  tobramycin (NEBCIN) powder  Status:  Discontinued       As needed 12/30/17 0929 12/30/17 1057   12/30/17 0929  vancomycin  (VANCOCIN) powder  Status:  Discontinued       As needed 12/30/17 0929 12/30/17 1057   12/30/17 0900  ceFAZolin (ANCEF) IVPB 2 g/50 mL premix     2 g 100 mL/hr over 30 Minutes Intravenous  Once 12/30/17 0851 12/30/17 0858   12/27/17 1056  vancomycin (VANCOCIN) powder  Status:  Discontinued       As needed 12/27/17 1056 12/27/17 1212   12/27/17 1000  ceFAZolin (ANCEF) IVPB 1 g/50 mL premix     1 g 100 mL/hr over 30 Minutes Intravenous  Once 12/27/17 1000 01/01/18 0909   12/26/17 2000  cefTRIAXone (ROCEPHIN) 2 g in sodium chloride 0.9 % 100 mL IVPB     2 g 200 mL/hr over 30 Minutes Intravenous Every 24 hours 12/26/17 1857     12/26/17 1540  vancomycin (VANCOCIN) powder  Status:  Discontinued       As  needed 12/26/17 1542 12/26/17 1656   12/26/17 1539  tobramycin (NEBCIN) powder  Status:  Discontinued       As needed 12/26/17 1539 12/26/17 1656   12/26/17 1200  ceFAZolin (ANCEF) IVPB 2g/100 mL premix  Status:  Discontinued     2 g 200 mL/hr over 30 Minutes Intravenous Every 8 hours 12/26/17 0640 12/26/17 1857   12/26/17 0400  ceFAZolin (ANCEF) IVPB 1 g/50 mL premix  Status:  Discontinued     1 g 100 mL/hr over 30 Minutes Intravenous Every 8 hours 12/25/17 2140 12/26/17 0640   12/25/17 1907  ceFAZolin (ANCEF) 2-4 GM/100ML-% IVPB    Note to Pharmacy:  Lysle Pearlumbarger, Rachel   : cabinet override      12/25/17 1907 12/25/17 2030        Consults: Treatment Team:  Roby LoftsHaddix, Kevin P, MD   Subjective:    Overnight Issues:   Objective:  Vital signs for last 24 hours: Temp:  [99.1 F (37.3 C)-101.3 F (38.5 C)] 100.8 F (38.2 C) (08/22 0800) Pulse Rate:  [90-121] 121 (08/22 0800) Resp:  [16-29] 29 (08/22 0800) BP: (88-140)/(45-89) 137/89 (08/22 0800) SpO2:  [93 %-100 %] 100 % (08/22 0800) Arterial Line BP: (80-152)/(41-118) 98/92 (08/22 0800) FiO2 (%):  [30 %] 30 % (08/22 0800) Weight:  [135.8 kg] 135.8 kg (08/22 0200)  Hemodynamic parameters for last 24 hours:    Intake/Output  from previous day: 08/21 0701 - 08/22 0700 In: 2706.5 [I.V.:1846.5; NG/GT:760; IV Piggyback:100.1] Out: 2050 [Urine:1800; Blood:250]  Intake/Output this shift: Total I/O In: 100 [I.V.:60; NG/GT:40] Out: 250 [Urine:250]  Vent settings for last 24 hours: Vent Mode: PRVC FiO2 (%):  [30 %] 30 % Set Rate:  [16 bmp] 16 bmp Vt Set:  [550 mL] 550 mL PEEP:  [5 cmH20] 5 cmH20 Plateau Pressure:  [22 cmH20-27 cmH20] 24 cmH20  Physical Exam:  General: on vent  Neuro: awake and F/C with sign language interpreter HEENT/Neck: ETT Resp: clear to auscultation bilaterally CVS: regular rate and rhythm, S1, S2 normal, no murmur, click, rub or gallop GI: soft, nontender, BS WNL, no r/g Extremities: ex fix RLE  Results for orders placed or performed during the hospital encounter of 12/25/17 (from the past 24 hour(s))  I-STAT 7, (LYTES, BLD GAS, ICA, H+H)     Status: Abnormal   Collection Time: 01/01/18  9:31 AM  Result Value Ref Range   pH, Arterial 7.375 7.350 - 7.450   pCO2 arterial 52.0 (H) 32.0 - 48.0 mmHg   pO2, Arterial 63.0 (L) 83.0 - 108.0 mmHg   Bicarbonate 30.2 (H) 20.0 - 28.0 mmol/L   TCO2 32 22 - 32 mmol/L   O2 Saturation 90.0 %   Acid-Base Excess 5.0 (H) 0.0 - 2.0 mmol/L   Sodium 145 135 - 145 mmol/L   Potassium 4.1 3.5 - 5.1 mmol/L   Calcium, Ion 1.11 (L) 1.15 - 1.40 mmol/L   HCT 24.0 (L) 36.0 - 46.0 %   Hemoglobin 8.2 (L) 12.0 - 15.0 g/dL   Patient temperature 54.037.6 C    Sample type ARTERIAL   I-STAT 7, (LYTES, BLD GAS, ICA, H+H)     Status: Abnormal   Collection Time: 01/01/18 10:25 AM  Result Value Ref Range   pH, Arterial 7.410 7.350 - 7.450   pCO2 arterial 46.9 32.0 - 48.0 mmHg   pO2, Arterial 97.0 83.0 - 108.0 mmHg   Bicarbonate 29.7 (H) 20.0 - 28.0 mmol/L   TCO2 31 22 - 32 mmol/L  O2 Saturation 97.0 %   Acid-Base Excess 5.0 (H) 0.0 - 2.0 mmol/L   Sodium 145 135 - 145 mmol/L   Potassium 4.2 3.5 - 5.1 mmol/L   Calcium, Ion 1.10 (L) 1.15 - 1.40 mmol/L   HCT 24.0  (L) 36.0 - 46.0 %   Hemoglobin 8.2 (L) 12.0 - 15.0 g/dL   Patient temperature 16.1 C    Sample type ARTERIAL   Glucose, capillary     Status: Abnormal   Collection Time: 01/01/18 12:04 PM  Result Value Ref Range   Glucose-Capillary 101 (H) 70 - 99 mg/dL   Comment 1 Notify RN    Comment 2 Document in Chart   Glucose, capillary     Status: Abnormal   Collection Time: 01/01/18  3:55 PM  Result Value Ref Range   Glucose-Capillary 171 (H) 70 - 99 mg/dL  Glucose, capillary     Status: Abnormal   Collection Time: 01/01/18  8:29 PM  Result Value Ref Range   Glucose-Capillary 111 (H) 70 - 99 mg/dL   Comment 1 Notify RN    Comment 2 Document in Chart   Glucose, capillary     Status: Abnormal   Collection Time: 01/02/18 12:06 AM  Result Value Ref Range   Glucose-Capillary 124 (H) 70 - 99 mg/dL   Comment 1 Notify RN    Comment 2 Document in Chart   Glucose, capillary     Status: None   Collection Time: 01/02/18  3:43 AM  Result Value Ref Range   Glucose-Capillary 83 70 - 99 mg/dL   Comment 1 Notify RN    Comment 2 Document in Chart   CBC     Status: Abnormal   Collection Time: 01/02/18  5:07 AM  Result Value Ref Range   WBC 13.2 (H) 4.0 - 10.5 K/uL   RBC 2.61 (L) 3.87 - 5.11 MIL/uL   Hemoglobin 7.6 (L) 12.0 - 15.0 g/dL   HCT 09.6 (L) 04.5 - 40.9 %   MCV 94.6 78.0 - 100.0 fL   MCH 29.1 26.0 - 34.0 pg   MCHC 30.8 30.0 - 36.0 g/dL   RDW 81.1 91.4 - 78.2 %   Platelets 198 150 - 400 K/uL  Basic metabolic panel     Status: Abnormal   Collection Time: 01/02/18  5:07 AM  Result Value Ref Range   Sodium 145 135 - 145 mmol/L   Potassium 4.1 3.5 - 5.1 mmol/L   Chloride 109 98 - 111 mmol/L   CO2 30 22 - 32 mmol/L   Glucose, Bld 115 (H) 70 - 99 mg/dL   BUN 21 (H) 6 - 20 mg/dL   Creatinine, Ser 9.56 0.44 - 1.00 mg/dL   Calcium 7.7 (L) 8.9 - 10.3 mg/dL   GFR calc non Af Amer >60 >60 mL/min   GFR calc Af Amer >60 >60 mL/min   Anion gap 6 5 - 15  Glucose, capillary     Status: Abnormal    Collection Time: 01/02/18  8:07 AM  Result Value Ref Range   Glucose-Capillary 109 (H) 70 - 99 mg/dL    Assessment & Plan: Present on Admission: . Liver laceration, closed, initial encounter . Open fracture of right tibia and fibula, type III, initial encounter . Closed displaced comminuted fracture of shaft of right humerus    LOS: 8 days   Additional comments:I reviewed the patient's new clinical lab test results. 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 Franky Macho ABL anemia  ID - fever, will CX blood, resp. Start empiric vanc/zosyn Thrombocytopenia - consumptive, improved Open R tib fib - washed out in ED. S/P Ex fix by Dr. Jena Gauss 8/15, S/P I&D and ABX bead placement 8/19 by Dr. Carola Frost, S/P partial ORIF tibia 8/21 by Dr. Jena Gauss R humerus FX - S/P ORIF by Dr. Andris Baumann second distal phalanx FX - per Dr. Jena Gauss L 2-4 MC FXs - S/P ORIF/CR by Dr. Jena Gauss 8/16 L L2-3 TVP FXs FEN - evac stomach prior to extubation VTE - Lovenox Dispo - ICU Critical Care Total Time*: 39 Minutes  Violeta Gelinas, MD, MPH, FACS Trauma: [phone] General Surgery: [phone]  01/02/2018  *Care during the described time interval was provided by me. I have reviewed this patient's available data, including medical history, events of note, physical examination and test results as part of my evaluation.
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
## 2018-01-02 NOTE — Progress Notes (Signed)
Pharmacy Antibiotic Note  Dorothy Wilson is a 47 y.o. female admitted on 12/25/2017 with sepsis.  Pharmacy has been consulted for vancomycin and Zosyn dosing.  Abx for open fracture prophylaxis. Having new fevers so broadened. Has been getting vanc powder in OR. WBC mostly stable but elevated at 13.2.  Plan: Start Zosyn 3.375 gm IV q8h (4 hour infusion) Start vancomycin 1g IV Q8h Monitor clinical picture, renal function, VT prn F/U new C&S, abx deescalation / LOT  Height: 5\' 6"  (167.6 cm) Weight: 299 lb 6.2 oz (135.8 kg) IBW/kg (Calculated) : 59.3  Temp (24hrs), Avg:100.2 F (37.9 C), Min:99.1 F (37.3 C), Max:101.3 F (38.5 C)  Recent Labs  Lab 12/27/17 0500  12/29/17 0511 12/30/17 0539 12/31/17 0751 01/01/18 0456 01/02/18 0507  WBC 9.8   < > 8.7 14.1* 12.5* 12.7* 13.2*  CREATININE  --    < > 0.76 0.66 0.62 0.57 0.77  LATICACIDVEN 1.4  --   --   --   --   --   --    < > = values in this interval not displayed.    Estimated Creatinine Clearance: 122.1 mL/min (by C-G formula based on SCr of 0.77 mg/dL).    No Known Allergies  Thank you for allowing pharmacy to be a part of this patient's care.  Armandina StammerBATCHELDER,[employer] J 01/02/2018 10:37 AM
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
## 2018-01-02 NOTE — Anesthesia Postprocedure Evaluation (Signed)
Anesthesia Post Note  Patient: Dorothy Wilson  Procedure(s) Performed: IRRIGATION AND DEBRIDEMENT EXTREMITY (Right ) OPEN REDUCTION INTERNAL FIXATION (ORIF) TIBIAL PLATEAU (Right Knee)     Patient location during evaluation: SICU Anesthesia Type: General Level of consciousness: sedated Pain management: pain level controlled Vital Signs Assessment: post-procedure vital signs reviewed and stable Respiratory status: patient remains intubated per anesthesia plan Cardiovascular status: stable Postop Assessment: no apparent nausea or vomiting Anesthetic complications: no    Last Vitals:  Vitals:   01/02/18 0800 01/02/18 0851  BP: 137/89   Pulse: (!) 121   Resp: (!) 29   Temp: (!) 38.2 C   SpO2: 100% 98%    Last Pain:  Vitals:   01/02/18 0400  TempSrc: Bladder  PainSc:    Pain Goal:                 Dorothy Wilson

## 2018-01-02 NOTE — Procedures (Signed)
Extubation Procedure Note  Patient Details:   Name: Dorothy Wilson DOB: [birth year] MRN: [id number]   Airway Documentation:    Vent end date: 01/02/18 Vent end time: 0851   Evaluation  O2 sats: stable throughout Complications: No apparent complications Patient did tolerate procedure well. Bilateral Breath Sounds: Rhonchi, Diminished    Pt extubated to 3L [place] per MD order. Pt stable throughout with no complications. Pt with positive cuff leak prior to extubation and has strong productive cough post extubation. Pt with slight stridor and rhonchi but clears with cough. Interpreter at bedside. Pt denies SOB, slight increased WOB. Pt states she "feels OK". RT will continue to closely monitor pt  Carolan ShiverKelley, Lajuane Leatham M 01/02/2018, 8:52 AM

## 2018-01-03 ENCOUNTER — Encounter (HOSPITAL_COMMUNITY): Payer: Self-pay | Admitting: Student

## 2018-01-03 ENCOUNTER — Inpatient Hospital Stay (HOSPITAL_COMMUNITY): Payer: No Typology Code available for payment source

## 2018-01-03 LAB — BASIC METABOLIC PANEL
Anion gap: 6 (ref 5–15)
BUN: 17 mg/dL (ref 6–20)
CHLORIDE: 107 mmol/L (ref 98–111)
CO2: 30 mmol/L (ref 22–32)
Calcium: 8.2 mg/dL — ABNORMAL LOW (ref 8.9–10.3)
Creatinine, Ser: 0.65 mg/dL (ref 0.44–1.00)
GFR calc Af Amer: >60 mL/min (ref >60)
GFR calc non Af Amer: >60 mL/min (ref >60)
GLUCOSE: 119 mg/dL — AB (ref 70–99)
Potassium: 4.3 mmol/L (ref 3.5–5.1)
Sodium: 143 mmol/L (ref 135–145)

## 2018-01-03 LAB — GLUCOSE, CAPILLARY
GLUCOSE-CAPILLARY: 109 mg/dL — AB (ref 70–99)
GLUCOSE-CAPILLARY: 118 mg/dL — AB (ref 70–99)
GLUCOSE-CAPILLARY: 101 mg/dL — AB (ref 70–99)
Glucose-Capillary: 100 mg/dL — ABNORMAL HIGH (ref 70–99)
Glucose-Capillary: 109 mg/dL — ABNORMAL HIGH (ref 70–99)
Glucose-Capillary: 136 mg/dL — ABNORMAL HIGH (ref 70–99)
Glucose-Capillary: 98 mg/dL (ref 70–99)

## 2018-01-03 LAB — CBC
HCT: 25.9 % — ABNORMAL LOW (ref 36.0–46.0)
Hemoglobin: 8 g/dL — ABNORMAL LOW (ref 12.0–15.0)
MCH: 29.5 pg (ref 26.0–34.0)
MCHC: 30.9 g/dL (ref 30.0–36.0)
MCV: 95.6 fL (ref 78.0–100.0)
Platelets: 235 10*3/uL (ref 150–400)
RBC: 2.71 MIL/uL — ABNORMAL LOW (ref 3.87–5.11)
RDW: 14.3 % (ref 11.5–15.5)
WBC: 17 10*3/uL — AB (ref 4.0–10.5)

## 2018-01-03 MED ORDER — PANTOPRAZOLE SODIUM 40 MG PO PACK
40.0000 mg | PACK | Freq: Every day | ORAL | Status: DC
Start: 2018-01-04 — End: 2018-01-05
  Filled 2018-01-03: qty 20

## 2018-01-03 MED ORDER — POLYETHYLENE GLYCOL 3350 17 G PO PACK: 17.0000 g | PACK | Freq: Every day | ORAL | Status: DC | PRN

## 2018-01-03 NOTE — Progress Notes (Signed)
2 Days Post-Op   Subjective/Chief Complaint: Pt con't extubated today NAE overnight.   Objective: Vital signs in last 24 hours: Temp:  [97.7 F (36.5 C)-100.8 F (38.2 C)] 98.5 F (36.9 C) (08/23 0318) Pulse Rate:  [92-122] 106 (08/23 0600) Resp:  [26-46] 26 (08/23 0600) BP: (85-182)/(36-111) 157/83 (08/23 0600) SpO2:  [95 %-100 %] 99 % (08/23 0724) Arterial Line BP: (96-176)/(88-128) 126/100 (08/22 2200) FiO2 (%):  [30 %] 30 % (08/22 1959) Weight:  [136.4 kg] 136.4 kg (08/23 0600) Last BM Date: (PTA)  Intake/Output from previous day: 08/22 0701 - 08/23 0700 In: 1810.8 [I.V.:1021.2; NG/GT:40; IV Piggyback:749.6] Out: 1925 [Urine:1925] Intake/Output this shift: No intake/output data recorded.  Constitutional: No acute distress, appears states age, incoherent speech Eyes: Anicteric sclerae, moist conjunctiva, no lid lag Lungs: Clear to auscultation bilaterally, normal respiratory effort CV: regular rate and rhythm, no murmurs, no peripheral edema, pedal pulses 2+ GI: Soft, no masses or hepatosplenomegaly, non-tender to palpation Ext: RLE ex fix in place Skin: No rashes, palpation reveals normal turgor    Lab Results:  Recent Labs    01/02/18 0507 01/03/18 0351  WBC 13.2* 17.0*  HGB 7.6* 8.0*  HCT 24.7* 25.9*  PLT 198 235   BMET Recent Labs    01/02/18 0507 01/03/18 0351  NA 145 143  K 4.1 4.3  CL 109 107  CO2 30 30  GLUCOSE 115* 119*  BUN 21* 17  CREATININE 0.77 0.65  CALCIUM 7.7* 8.2*   PT/INR No results for input(s): LABPROT, INR in the last 72 hours. ABG Recent Labs    01/01/18 0931 01/01/18 1025  PHART 7.375 7.410  HCO3 30.2* 29.7*    Studies/Results: Dg Chest Port 1 View  Result Date: 01/03/2018 CLINICAL DATA:  Respiratory failure. EXAM: PORTABLE CHEST 1 VIEW COMPARISON:  01/02/2018. FINDINGS: Left PICC line noted with tip in stable position. Mediastinum appears stable. Heart size stable. Right base atelectatic changes again noted.  Significant progression of right-sided pleural effusion. No pneumothorax. Right sided rib fractures again noted. IMPRESSION: 1.  Left PICC line stable position. 2. Significant progression of right-sided pleural effusion. Persistent basilar atelectasis on the right again noted. 3.  Right-sided rib fractures again noted. Electronically Signed   By: Maisie Fushomas  Register   On: 01/03/2018 06:55   Dg Chest Port 1 View  Result Date: 01/02/2018 CLINICAL DATA:  Respiratory failure. EXAM: PORTABLE CHEST 1 VIEW COMPARISON:  01/01/2018 FINDINGS: Endotracheal tube in good position. Central venous catheter tip at the cavoatrial junction unchanged. NG tube in the stomach. Right pleural effusion and right lower lobe airspace disease unchanged. Right rib fractures. Mild left lower lobe atelectasis also unchanged. IMPRESSION: Moderate right effusion and right lower lobe atelectasis unchanged. Right rib fractures. Support apparatus in good position and unchanged. Electronically Signed   By: Marlan Palauharles  Clark M.D.   On: 01/02/2018 08:01   Dg Knee Complete 4 Views Right  Result Date: 01/01/2018 CLINICAL DATA:  Proximal tibia ORIF EXAM: RIGHT KNEE - COMPLETE 4+ VIEW; DG C-ARM 61-120 MIN COMPARISON:  CT 12/30/2017 FINDINGS: Intraprocedural fluoroscopic images show removal of antibiotic beads with placement of buttress plate. External fixation hardware is in place. Fibular neck fracture, known. No unexpected finding. IMPRESSION: Fluoroscopy for antibiotic cement removal and tibial fixation. Electronically Signed   By: Marnee SpringJonathon  Watts M.D.   On: 01/01/2018 11:15   Dg Knee Right Port  Result Date: 01/01/2018 CLINICAL DATA:  Status post fixation of a right tibial fracture today. EXAM: PORTABLE RIGHT KNEE -  1-2 VIEW COMPARISON:  CT right tibia 12/30/2017. Intraoperative imaging today. FINDINGS: The patient has new lateral plate and screws for fixation of a comminuted proximal tibial fracture. A large bone fragment from the proximal  metaphysis and diaphysis measuring 7.7 cm craniocaudal by approximately 3.3 cm transverse is medially displaced 4.7 cm into the soft tissues. Displacement of this fragment leads a large gap in the posterior and medial aspects of the proximal tibia. Vac drain and surgical staples are noted. Proximal fibular fracture is noted. IMPRESSION: Status post ORIF of a proximal tibial fracture as described above. Large bone fragment medially displaced into the soft tissues is noted as seen on the prior exams. Electronically Signed   By: Drusilla Kanner M.D.   On: 01/01/2018 15:28   Dg C-arm 1-60 Min  Result Date: 01/01/2018 CLINICAL DATA:  Proximal tibia ORIF EXAM: RIGHT KNEE - COMPLETE 4+ VIEW; DG C-ARM 61-120 MIN COMPARISON:  CT 12/30/2017 FINDINGS: Intraprocedural fluoroscopic images show removal of antibiotic beads with placement of buttress plate. External fixation hardware is in place. Fibular neck fracture, known. No unexpected finding. IMPRESSION: Fluoroscopy for antibiotic cement removal and tibial fixation. Electronically Signed   By: Marnee Spring M.D.   On: 01/01/2018 11:15   Dg C-arm 1-60 Min  Result Date: 01/01/2018 CLINICAL DATA:  Proximal tibia ORIF EXAM: RIGHT KNEE - COMPLETE 4+ VIEW; DG C-ARM 61-120 MIN COMPARISON:  CT 12/30/2017 FINDINGS: Intraprocedural fluoroscopic images show removal of antibiotic beads with placement of buttress plate. External fixation hardware is in place. Fibular neck fracture, known. No unexpected finding. IMPRESSION: Fluoroscopy for antibiotic cement removal and tibial fixation. Electronically Signed   By: Marnee Spring M.D.   On: 01/01/2018 11:15    Anti-infectives: Anti-infectives (From admission, onward)   Start     Dose/Rate Route Frequency Ordered Stop   01/02/18 1030  piperacillin-tazobactam (ZOSYN) IVPB 3.375 g     3.375 g 12.5 mL/hr over 240 Minutes Intravenous Every 8 hours 01/02/18 0918     01/02/18 1000  vancomycin (VANCOCIN) IVPB 1000 mg/200 mL  premix     1,000 mg 200 mL/hr over 60 Minutes Intravenous Every 8 hours 01/02/18 0927     01/01/18 1029  tobramycin (NEBCIN) powder  Status:  Discontinued       As needed 01/01/18 1029 01/01/18 1139   01/01/18 1029  vancomycin (VANCOCIN) powder  Status:  Discontinued       As needed 01/01/18 1029 01/01/18 1139   01/01/18 0915  ceFAZolin (ANCEF) 3 g in dextrose 5 % 50 mL IVPB  Status:  Discontinued     3 g 160 mL/hr over 30 Minutes Intravenous To Surgery 01/01/18 0908 01/01/18 1155   12/30/17 0929  tobramycin (NEBCIN) powder  Status:  Discontinued       As needed 12/30/17 0929 12/30/17 1057   12/30/17 0929  vancomycin (VANCOCIN) powder  Status:  Discontinued       As needed 12/30/17 0929 12/30/17 1057   12/30/17 0900  ceFAZolin (ANCEF) IVPB 2 g/50 mL premix     2 g 100 mL/hr over 30 Minutes Intravenous  Once 12/30/17 0851 12/30/17 0858   12/27/17 1056  vancomycin (VANCOCIN) powder  Status:  Discontinued       As needed 12/27/17 1056 12/27/17 1212   12/27/17 1000  ceFAZolin (ANCEF) IVPB 1 g/50 mL premix     1 g 100 mL/hr over 30 Minutes Intravenous  Once 12/27/17 1000 01/01/18 0909   12/26/17 2000  cefTRIAXone (ROCEPHIN) 2 g  in sodium chloride 0.9 % 100 mL IVPB  Status:  Discontinued     2 g 200 mL/hr over 30 Minutes Intravenous Every 24 hours 12/26/17 1857 01/02/18 0918   12/26/17 1540  vancomycin (VANCOCIN) powder  Status:  Discontinued       As needed 12/26/17 1542 12/26/17 1656   12/26/17 1539  tobramycin (NEBCIN) powder  Status:  Discontinued       As needed 12/26/17 1539 12/26/17 1656   12/26/17 1200  ceFAZolin (ANCEF) IVPB 2g/100 mL premix  Status:  Discontinued     2 g 200 mL/hr over 30 Minutes Intravenous Every 8 hours 12/26/17 0640 12/26/17 1857   12/26/17 0400  ceFAZolin (ANCEF) IVPB 1 g/50 mL premix  Status:  Discontinued     1 g 100 mL/hr over 30 Minutes Intravenous Every 8 hours 12/25/17 2140 12/26/17 0640   12/25/17 1907  ceFAZolin (ANCEF) 2-4 GM/100ML-% IVPB    Note  to Pharmacy:  Lysle Pearl   : cabinet override      12/25/17 1907 12/25/17 2030      Assessment/Plan: Assessment & Plan: Present on Admission: . Liver laceration, closed, initial encounter . Open fracture of right tibia and fibula, type III, initial encounter . Closed displaced comminuted fracture of shaft of right humerus    LOS: 8 days   Additional comments:I reviewed the patient's new clinical lab test results. 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 Franky Macho ABL anemia  ID - fever, Pending-CX blood, resp. Empiric vanc/zosyn Thrombocytopenia - consumptive, improved Open R tib fib - washed out in ED. S/P Ex fix by Dr. Jena Gauss 8/15, S/P I&D and ABX bead placement 8/19 by Dr. Carola Frost, S/P partial ORIF tibia 8/21 by Dr. Jena Gauss. Plan for surgery likely Monday R humerus FX - S/P ORIF by Dr. Carola Frost R second distal phalanx FX - per Dr. Jena Gauss L 2-4 MC FXs - S/P ORIF/CR by Dr. Jena Gauss 8/16 L L2-3 TVP FXs FEN - ST Eval today VTE - Lovenox Dispo - ICU Critical Care Total Time*: 0730-0800   LOS: 9 days    Marigene Ehlers., [facility] 01/03/2018
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
## 2018-01-04 ENCOUNTER — Inpatient Hospital Stay (HOSPITAL_COMMUNITY): Payer: No Typology Code available for payment source

## 2018-01-04 LAB — CBC WITH DIFFERENTIAL/PLATELET
ABS IMMATURE GRANULOCYTES: 0.2 10*3/uL — AB (ref 0.0–0.1)
Basophils Absolute: 0.1 10*3/uL (ref 0.0–0.1)
Basophils Relative: 0 %
Eosinophils Absolute: 0.4 10*3/uL (ref 0.0–0.7)
Eosinophils Relative: 2 %
HEMATOCRIT: 25.4 % — AB (ref 36.0–46.0)
Hemoglobin: 8 g/dL — ABNORMAL LOW (ref 12.0–15.0)
Immature Granulocytes: 1 %
LYMPHS ABS: 2.1 10*3/uL (ref 0.7–4.0)
LYMPHS PCT: 12 %
MCH: 29.4 pg (ref 26.0–34.0)
MCHC: 31.5 g/dL (ref 30.0–36.0)
MCV: 93.4 fL (ref 78.0–100.0)
Monocytes Absolute: 0.9 10*3/uL (ref 0.1–1.0)
Monocytes Relative: 5 %
NEUTROS ABS: 14.1 10*3/uL — AB (ref 1.7–7.7)
Neutrophils Relative %: 80 %
Platelets: 265 10*3/uL (ref 150–400)
RBC: 2.72 MIL/uL — ABNORMAL LOW (ref 3.87–5.11)
RDW: 14.1 % (ref 11.5–15.5)
WBC: 17.7 10*3/uL — ABNORMAL HIGH (ref 4.0–10.5)

## 2018-01-04 LAB — GLUCOSE, CAPILLARY
GLUCOSE-CAPILLARY: 131 mg/dL — AB (ref 70–99)
GLUCOSE-CAPILLARY: 91 mg/dL (ref 70–99)
Glucose-Capillary: 118 mg/dL — ABNORMAL HIGH (ref 70–99)
Glucose-Capillary: 102 mg/dL — ABNORMAL HIGH (ref 70–99)
Glucose-Capillary: 108 mg/dL — ABNORMAL HIGH (ref 70–99)
Glucose-Capillary: 89 mg/dL (ref 70–99)

## 2018-01-04 MED ORDER — METOPROLOL TARTRATE 25 MG PO TABS
25.0000 mg | ORAL_TABLET | Freq: Two times a day (BID) | ORAL | Status: DC
  Administered 2018-01-04 – 2018-01-09 (×12): 25 mg via ORAL
  Filled 2018-01-04 (×12): qty 1

## 2018-01-04 NOTE — Progress Notes (Signed)
3 Days Post-Op   Subjective/Chief Complaint: Complains of some pain in the back. She is working hard to breathe but she says she is always this way   Objective: Vital signs in last 24 hours: Temp:  [97.9 F (36.6 C)-99.7 F (37.6 C)] 99.7 F (37.6 C) (08/24 0400) Pulse Rate:  [73-118] 106 (08/24 0700) Resp:  [18-41] 24 (08/24 0700) BP: (126-175)/(37-136) 154/107 (08/24 0700) SpO2:  [96 %-100 %] 100 % (08/24 0700) Weight:  [134.9 kg] 134.9 kg (08/24 0600) Last BM Date: (PTA, MD notified)  Intake/Output from previous day: 08/23 0701 - 08/24 0700 In: 1926.5 [I.V.:1176.6; IV Piggyback:750] Out: 1875 [Urine:1875] Intake/Output this shift: No intake/output data recorded.  General appearance: alert and cooperative Neck: in C collar Resp: slightly decreased on right. increased work of breathing Cardio: regular rate and rhythm GI: soft, non-tender; bowel sounds normal; no masses,  no organomegaly Extremities: warm  Lab Results:  Recent Labs    01/02/18 0507 01/03/18 0351  WBC 13.2* 17.0*  HGB 7.6* 8.0*  HCT 24.7* 25.9*  PLT 198 235   BMET Recent Labs    01/02/18 0507 01/03/18 0351  NA 145 143  K 4.1 4.3  CL 109 107  CO2 30 30  GLUCOSE 115* 119*  BUN 21* 17  CREATININE 0.77 0.65  CALCIUM 7.7* 8.2*   PT/INR No results for input(s): LABPROT, INR in the last 72 hours. ABG Recent Labs    01/01/18 0931 01/01/18 1025  PHART 7.375 7.410  HCO3 30.2* 29.7*    Studies/Results: Dg Chest Port 1 View  Result Date: 01/04/2018 CLINICAL DATA:  Follow-up pleural effusion EXAM: PORTABLE CHEST 1 VIEW COMPARISON:  01/03/2018 FINDINGS: Left arm PICC tip again at the SVC RA junction. The left chest is clear. Large right effusion again seen with associated right lung volume loss. The right effusion may be slightly smaller. No new finding. IMPRESSION: Similar appearance to yesterday. Large right effusion with right lung volume loss. Right effusion could be slightly smaller.  Electronically Signed   By: Paulina Fusi M.D.   On: 01/04/2018 07:13   Dg Chest Port 1 View  Result Date: 01/03/2018 CLINICAL DATA:  Respiratory failure. EXAM: PORTABLE CHEST 1 VIEW COMPARISON:  01/02/2018. FINDINGS: Left PICC line noted with tip in stable position. Mediastinum appears stable. Heart size stable. Right base atelectatic changes again noted. Significant progression of right-sided pleural effusion. No pneumothorax. Right sided rib fractures again noted. IMPRESSION: 1.  Left PICC line stable position. 2. Significant progression of right-sided pleural effusion. Persistent basilar atelectasis on the right again noted. 3.  Right-sided rib fractures again noted. Electronically Signed   By: Maisie Fus  Register   On: 01/03/2018 06:55    Anti-infectives: Anti-infectives (From admission, onward)   Start     Dose/Rate Route Frequency Ordered Stop   01/02/18 1030  piperacillin-tazobactam (ZOSYN) IVPB 3.375 g     3.375 g 12.5 mL/hr over 240 Minutes Intravenous Every 8 hours 01/02/18 0918     01/02/18 1000  vancomycin (VANCOCIN) IVPB 1000 mg/200 mL premix     1,000 mg 200 mL/hr over 60 Minutes Intravenous Every 8 hours 01/02/18 0927     01/01/18 1029  tobramycin (NEBCIN) powder  Status:  Discontinued       As needed 01/01/18 1029 01/01/18 1139   01/01/18 1029  vancomycin (VANCOCIN) powder  Status:  Discontinued       As needed 01/01/18 1029 01/01/18 1139   01/01/18 0915  ceFAZolin (ANCEF) 3 g in dextrose  5 % 50 mL IVPB  Status:  Discontinued     3 g 160 mL/hr over 30 Minutes Intravenous To Surgery 01/01/18 0908 01/01/18 1155   12/30/17 0929  tobramycin (NEBCIN) powder  Status:  Discontinued       As needed 12/30/17 0929 12/30/17 1057   12/30/17 0929  vancomycin (VANCOCIN) powder  Status:  Discontinued       As needed 12/30/17 0929 12/30/17 1057   12/30/17 0900  ceFAZolin (ANCEF) IVPB 2 g/50 mL premix     2 g 100 mL/hr over 30 Minutes Intravenous  Once 12/30/17 0851 12/30/17 0858   12/27/17  1056  vancomycin (VANCOCIN) powder  Status:  Discontinued       As needed 12/27/17 1056 12/27/17 1212   12/27/17 1000  ceFAZolin (ANCEF) IVPB 1 g/50 mL premix     1 g 100 mL/hr over 30 Minutes Intravenous  Once 12/27/17 1000 01/01/18 0909   12/26/17 2000  cefTRIAXone (ROCEPHIN) 2 g in sodium chloride 0.9 % 100 mL IVPB  Status:  Discontinued     2 g 200 mL/hr over 30 Minutes Intravenous Every 24 hours 12/26/17 1857 01/02/18 0918   12/26/17 1540  vancomycin (VANCOCIN) powder  Status:  Discontinued       As needed 12/26/17 1542 12/26/17 1656   12/26/17 1539  tobramycin (NEBCIN) powder  Status:  Discontinued       As needed 12/26/17 1539 12/26/17 1656   12/26/17 1200  ceFAZolin (ANCEF) IVPB 2g/100 mL premix  Status:  Discontinued     2 g 200 mL/hr over 30 Minutes Intravenous Every 8 hours 12/26/17 0640 12/26/17 1857   12/26/17 0400  ceFAZolin (ANCEF) IVPB 1 g/50 mL premix  Status:  Discontinued     1 g 100 mL/hr over 30 Minutes Intravenous Every 8 hours 12/25/17 2140 12/26/17 0640   12/25/17 1907  ceFAZolin (ANCEF) 2-4 GM/100ML-% IVPB    Note to Pharmacy:  Dorothy Wilson, Dorothy Wilson   : cabinet override      12/25/17 1907 12/25/17 2030      Assessment/Plan: s/p Procedure(s): IRRIGATION AND DEBRIDEMENT EXTREMITY (Right) OPEN REDUCTION INTERNAL FIXATION (ORIF) TIBIAL PLATEAU (Right) recheck wbc and hg today  PHBC Acute hypoxic ventilator dependent respiratory failure- extubated Mult R rib FX with HPTX/pulm contusion- CXR with increase R pleural eff. 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 Franky Machoabbell ABL anemia ID- fever, Pending-CX blood, resp. Empiric vanc/zosyn Thrombocytopenia- consumptive, improved Open R tib fib- washed out in ED. S/P Ex fix by Dr. Jena GaussHaddix 8/15, S/P I&D and ABX bead placement 8/19 by Dr. Carola FrostHandy, S/P partial ORIF tibia 8/21 by Dr. Jena GaussHaddix. Plan for surgery likely Monday R  humerus FX- S/P ORIF by Dr. Carola FrostHandy R second distal phalanx FX- per Dr. Jena GaussHaddix L 2-4 MC FXs- S/P ORIF/CR by Dr. Jena GaussHaddix 8/16 L L2-3 TVP FXs FEN- ST Eval today VTE- Lovenox Dispo- ICU  LOS: 10 days    TOTH III,Dorothy Wilson S 01/04/2018

## 2018-01-04 NOTE — Plan of Care (Signed)
Pt now on clear liquid diet.

## 2018-01-04 NOTE — Progress Notes (Signed)
SPORTS MEDICINE AND JOINT REPLACEMENT  Dorothy SpurlingStephen Lucey, MD    Dorothy Nancyolby Betzabeth Derringer, PA-C [address], [place], [place]  [id number]                             [phone]   PROGRESS NOTE - reported by sign language translator  Subjective:  negative for Chest Pain  negative for Shortness of Breath  negative for Nausea/Vomiting   negative for Calf Pain  negative for Bowel Movement   Tolerating Diet: yes         Patient reports pain as 5 on 0-10 scale.    Objective: Vital signs in last 24 hours:    Patient Vitals for the past 24 hrs:  BP Temp Temp src Pulse Resp SpO2 Weight  01/04/18 0838 - 98.1 F (36.7 C) Oral - - 94 % -  01/04/18 0700 (!) 154/107 - - (!) 106 (!) 24 100 % -  01/04/18 0600 (!) 143/56 - - 99 (!) 31 100 % 134.9 kg  01/04/18 0500 (!) 148/80 - - 100 (!) 37 99 % -  01/04/18 0400 (!) 147/50 99.7 F (37.6 C) Oral (!) 101 (!) 29 97 % -  01/04/18 0330 - - - 100 (!) 32 96 % -  01/04/18 0300 (!) 159/71 - - (!) 101 (!) 31 96 % -  01/04/18 0200 (!) 159/69 - - 100 (!) 35 97 % -  01/04/18 0100 - - - (!) 106 (!) 27 99 % -  01/04/18 0000 (!) 157/136 97.9 F (36.6 C) Oral 99 (!) 34 97 % -  01/03/18 2300 (!) 161/75 - - 100 (!) 36 97 % -  01/03/18 2200 (!) 160/74 - - 96 (!) 38 99 % -  01/03/18 2100 (!) 175/56 - - (!) 103 (!) 34 99 % -  01/03/18 2002 - - - - - 99 % -  01/03/18 2000 (!) 168/79 99.7 F (37.6 C) Oral (!) 107 (!) 32 98 % -  01/03/18 1900 (!) 173/78 - - (!) 101 (!) 28 98 % -  01/03/18 1800 (!) 175/83 - - (!) 109 (!) 41 97 % -  01/03/18 1700 (!) 172/78 - - (!) 103 (!) 32 100 % -  01/03/18 1600 (!) 165/65 98.8 F (37.1 C) Oral (!) 109 (!) 30 100 % -  01/03/18 1500 (!) 172/37 - - (!) 104 (!) 33 98 % -  01/03/18 1405 - - - (!) 113 (!) 36 99 % -  01/03/18 1400 (!) 161/81 - - (!) 118 (!) 33 98 % -  01/03/18 1300 (!) 147/103 - - (!) 104 (!) 29 98 % -  01/03/18 1200 126/78 - - (!) 110 (!) 31 100 % -  01/03/18 1153 - - - (!) 113 (!) 38 100 % -  01/03/18 1100 (!)  157/75 98.3 F (36.8 C) Oral (!) 108 18 98 % -    @flow {1959:LAST@   Intake/Output from previous day:   08/23 0701 - 08/24 0700 In: 1926.5 [I.V.:1176.6] Out: 1875 [Urine:1875]   Intake/Output this shift:   No intake/output data recorded.   Intake/Output      08/23 0701 - 08/24 0700 08/24 0701 - 08/25 0700   I.V. (mL/kg) 1176.6 (8.7)    NG/GT     IV Piggyback 750    Total Intake(mL/kg) 1926.5 (14.3)    Urine (mL/kg/hr) 1875 (0.6)    Drains 0    Total Output 1875  Net +51.5         Urine Occurrence 1 x       LABORATORY DATA: Recent Labs    12/28/17 1700 12/29/17 0511 12/30/17 0539 12/31/17 0751 01/01/18 0456 01/01/18 0931 01/01/18 1025 01/02/18 0507 01/03/18 0351  WBC 8.2 8.7 14.1* 12.5* 12.7*  --   --  13.2* 17.0*  HGB 9.0* 8.9* 10.1* 9.0* 8.1* 8.2* 8.2* 7.6* 8.0*  HCT 27.9* 28.4* 32.0* 28.3* 26.7* 24.0* 24.0* 24.7* 25.9*  PLT 65* 90* 126* 132* 146*  --   --  198 235   Recent Labs    12/29/17 0511 12/30/17 0539 12/31/17 0751 01/01/18 0456 01/01/18 0931 01/01/18 1025 01/02/18 0507 01/03/18 0351  NA 145 144 146* 144 145 145 145 143  K 4.2 4.3 4.5 4.7 4.1 4.2 4.1 4.3  CL 113* 110 108 112*  --   --  109 107  CO2 27 27 28 29   --   --  30 30  BUN 12 11 18 17   --   --  21* 17  CREATININE 0.76 0.66 0.62 0.57  --   --  0.77 0.65  GLUCOSE 133* 130* 127* 104*  --   --  115* 119*  CALCIUM 7.8* 7.8* 8.0* 7.3*  --   --  7.7* 8.2*   Lab Results  Component Value Date   INR 1.26 12/26/2017   INR 1.29 12/26/2017   INR 1.29 12/25/2017    Examination:  General appearance: alert, cooperative and no distress Extremities: extremities normal, atraumatic, no cyanosis or edema  Wound Exam: clean, dry, intact   Drainage:  Scant/small amount Serous exudate  Motor Exam: Anterior Tibial, Posterior Tibial, Quadriceps and Hamstrings Intact  Sensory Exam: Superficial Peroneal, Deep Peroneal and Tibial normal   Assessment:    3 Days Post-Op  Procedure(s)  (LRB): IRRIGATION AND DEBRIDEMENT EXTREMITY (Right) OPEN REDUCTION INTERNAL FIXATION (ORIF) TIBIAL PLATEAU (Right)  ADDITIONAL DIAGNOSIS:  Active Problems:   Liver laceration, closed, initial encounter   Open fracture of tibial plateau, right, type III, initial encounter   Closed displaced comminuted fracture of shaft of right humerus   Closed displaced fracture of neck of left second metacarpal bone   Closed displaced fracture of neck of left third metacarpal bone   Closed fracture of base of fourth metacarpal bone of left hand   Degloving injury of lower leg, initial encounter   Pulmonary contusion   Multiple rib fractures     Plan: Physical Therapy as ordered Non Weight Bearing (NWB)  DVT Prophylaxis:  Lovenox  Will continue to follow patient over the weekend. Plan to return to OR next week by dr haddix for removal of ex fix with permanent hardware fixation.          Dorothy Wilson 01/04/2018, 10:02 AM
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
## 2018-01-05 ENCOUNTER — Inpatient Hospital Stay (HOSPITAL_COMMUNITY): Payer: No Typology Code available for payment source

## 2018-01-05 LAB — GLUCOSE, CAPILLARY
GLUCOSE-CAPILLARY: 124 mg/dL — AB (ref 70–99)
GLUCOSE-CAPILLARY: 151 mg/dL — AB (ref 70–99)
Glucose-Capillary: 105 mg/dL — ABNORMAL HIGH (ref 70–99)
Glucose-Capillary: 110 mg/dL — ABNORMAL HIGH (ref 70–99)
Glucose-Capillary: 120 mg/dL — ABNORMAL HIGH (ref 70–99)
Glucose-Capillary: 92 mg/dL (ref 70–99)

## 2018-01-05 MED ORDER — DOCUSATE SODIUM 100 MG PO CAPS
100.0000 mg | ORAL_CAPSULE | Freq: Two times a day (BID) | ORAL | Status: DC
  Administered 2018-01-05 – 2018-01-09 (×6): 100 mg via ORAL
  Filled 2018-01-05 (×7): qty 1

## 2018-01-05 MED ORDER — LIDOCAINE HCL (PF) 1 % IJ SOLN
INTRAMUSCULAR | Status: AC
  Filled 2018-01-05: qty 30

## 2018-01-05 MED ORDER — DOCUSATE SODIUM 50 MG/5ML PO LIQD: 100.0000 mg | Freq: Two times a day (BID) | ORAL | Status: DC

## 2018-01-05 MED ORDER — IPRATROPIUM-ALBUTEROL 0.5-2.5 (3) MG/3ML IN SOLN
3.0000 mL | Freq: Three times a day (TID) | RESPIRATORY_TRACT | Status: DC
  Administered 2018-01-05 – 2018-01-08 (×7): 3 mL via RESPIRATORY_TRACT
  Filled 2018-01-05 (×8): qty 3

## 2018-01-05 MED ORDER — PANTOPRAZOLE SODIUM 40 MG PO TBEC
40.0000 mg | DELAYED_RELEASE_TABLET | Freq: Every day | ORAL | Status: DC
  Administered 2018-01-05 – 2018-01-09 (×4): 40 mg via ORAL
  Filled 2018-01-05 (×4): qty 1

## 2018-01-05 NOTE — Procedures (Signed)
   US guided Rt thoracentesis  1 L bloody fluid obtained Fluid saved for labs if MD requests  Tolerated well  CXR pending

## 2018-01-05 NOTE — Progress Notes (Signed)
Pharmacy Antibiotic Note  Dorothy Wilson is a 47 y.o. female admitted on 12/25/2017 with sepsis.  Pharmacy has been consulted for vancomycin and Zosyn dosing for open fracture prophylaxis. WBC elevated but stable ~17, currently AF. Scr stable < 1, estimated CrCl > 100 mL/min.  Plan: Continue 3.375 gm IV q8h (4 hour infusion) Continue vancomycin 1g IV q8h F/u abx plan post-op (planned 8/26) - if abx continue, get vancomycin trough F/u clinical status, renal function, C&S, de-escalation, LOT  Height: 5\' 6"  (167.6 cm) Weight: 285 lb 4.4 oz (129.4 kg) IBW/kg (Calculated) : 59.3  Temp (24hrs), Avg:98.6 F (37 C), Min:97.8 F (36.6 C), Max:99.3 F (37.4 C)  Recent Labs  Lab 12/30/17 0539 12/31/17 0751 01/01/18 0456 01/02/18 0507 01/03/18 0351 01/04/18 1009  WBC 14.1* 12.5* 12.7* 13.2* 17.0* 17.7*  CREATININE 0.66 0.62 0.57 0.77 0.65  --     Estimated Creatinine Clearance: 118.5 mL/min (by C-G formula based on SCr of 0.65 mg/dL).    No Known Allergies  Antibiotics this admission: Cefazolin 8/14 >> 8/15 Ceftriaxone 8/15 >> 8/22 Vancomycin 8/22 >> Zosyn 8/22 >>  Microbiology: 8/22 BCx: NGx2 8/14 UCx: NG final 8/14 and 8/15 MRSA PCR: negative  Thank you for allowing pharmacy to be a part of this patient's care.  Roderic ScarceErin N. Zigmund Danieleja, PharmD PGY2 Infectious Diseases Pharmacy Resident Phone: [phone] 01/05/2018 11:11 AM

## 2018-01-05 NOTE — Progress Notes (Signed)
SPORTS MEDICINE AND JOINT REPLACEMENT  Dorothy SpurlingStephen Lucey, MD    Laurier Nancyolby Robbins, PA-C [address], [place], [place]  [id number]                             [phone]   PROGRESS NOTE  Subjective:  Complains of some pain with moving due to the ex fix. Slightly improved since yesterday   Tolerating Diet: yes         Patient reports pain as 4 on 0-10 scale.    Objective: Vital signs in last 24 hours:    Patient Vitals for the past 24 hrs:  BP Temp Temp src Pulse Resp SpO2 Weight  01/05/18 0700 (!) 117/46 - - 92 (!) 31 100 % -  01/05/18 0630 109/62 - - 96 (!) 26 100 % -  01/05/18 0600 (!) 126/52 - - 89 (!) 31 98 % 129.4 kg  01/05/18 0530 (!) 129/56 - - 98 (!) 25 100 % -  01/05/18 0500 132/60 - - 96 (!) 26 99 % -  01/05/18 0430 (!) 139/58 - - 91 (!) 29 100 % -  01/05/18 0400 131/64 97.8 F (36.6 C) Axillary 95 (!) 25 100 % -  01/05/18 0330 132/64 - - 95 (!) 23 100 % -  01/05/18 0300 135/64 - - 88 (!) 31 100 % -  01/05/18 0230 (!) 142/68 - - 92 (!) 29 100 % -  01/05/18 0213 - - - - - 100 % -  01/05/18 0200 (!) 150/62 - - 92 (!) 26 100 % -  01/05/18 0130 139/72 - - 87 (!) 30 100 % -  01/05/18 0100 (!) 153/58 - - 88 (!) 30 100 % -  01/05/18 0030 (!) 151/68 - - 91 20 100 % -  01/05/18 0000 (!) 141/75 99.3 F (37.4 C) Axillary 86 (!) 29 100 % -  01/04/18 2330 (!) 148/80 - - 90 20 100 % -  01/04/18 2300 (!) 157/65 - - 90 (!) 28 100 % -  01/04/18 2230 (!) 154/98 - - (!) 102 (!) 25 100 % -  01/04/18 2200 (!) 163/73 - - (!) 104 (!) 24 100 % -  01/04/18 2130 (!) 92/55 - - (!) 105 (!) 31 100 % -  01/04/18 2100 (!) 167/76 - - (!) 101 (!) 34 100 % -  01/04/18 2030 (!) 172/81 - - (!) 102 (!) 34 100 % -  01/04/18 2000 (!) 158/73 98.6 F (37 C) Axillary (!) 105 (!) 23 100 % -  01/04/18 1934 - - - - - 100 % -  01/04/18 1900 (!) 154/47 - - 100 (!) 41 100 % -  01/04/18 1800 (!) 157/81 - - (!) 105 (!) 33 100 % -  01/04/18 1700 (!) 143/40 - - 95 (!) 22 98 % -  01/04/18 1630 (!) 155/77 -  - 89 (!) 31 100 % -  01/04/18 1600 (!) 145/66 98.4 F (36.9 C) Oral 92 (!) 30 98 % -  01/04/18 1530 (!) 143/61 - - (!) 33 (!) 24 100 % -  01/04/18 1500 (!) 150/90 - - 91 (!) 29 100 % -  01/04/18 1400 (!) 145/75 - - 94 (!) 35 100 % -  01/04/18 1300 (!) 155/85 - - 86 (!) 35 100 % -  01/04/18 1200 (!) 143/110 98.2 F (36.8 C) Oral 90 20 100 % -  01/04/18 1130 (!) 155/131 - - 96 (!)  22 100 % -  01/04/18 1100 (!) 156/93 - - 89 (!) 27 98 % -  01/04/18 1030 (!) 154/72 - - 99 (!) 31 100 % -  01/04/18 1002 (!) 152/75 - - (!) 107 - - -  01/04/18 1000 (!) 152/75 - - (!) 109 (!) 28 97 % -  01/04/18 0930 (!) 131/101 - - 90 (!) 26 100 % -  01/04/18 0900 (!) 145/117 - - (!) 105 (!) 32 93 % -  01/04/18 0838 - 98.1 F (36.7 C) Oral - - 94 % -  01/04/18 0800 (!) 161/74 98.1 F (36.7 C) - (!) 101 (!) 31 100 % -    @flow {1959:LAST@   Intake/Output from previous day:   08/24 0701 - 08/25 0700 In: 2155 [P.O.:120; I.V.:1150.6] Out: 2925 [Urine:2925]   Intake/Output this shift:   No intake/output data recorded.   Intake/Output      08/24 0701 - 08/25 0700 08/25 0701 - 08/26 0700   P.O. 120    I.V. (mL/kg) 1150.6 (8.9)    IV Piggyback 884.4    Total Intake(mL/kg) 2155 (16.7)    Urine (mL/kg/hr) 2925 (0.9)    Drains 0    Total Output 2925    Net -770         Urine Occurrence 1 x       LABORATORY DATA: Recent Labs    12/30/17 0539 12/31/17 0751 01/01/18 0456 01/01/18 0931 01/01/18 1025 01/02/18 0507 01/03/18 0351 01/04/18 1009  WBC 14.1* 12.5* 12.7*  --   --  13.2* 17.0* 17.7*  HGB 10.1* 9.0* 8.1* 8.2* 8.2* 7.6* 8.0* 8.0*  HCT 32.0* 28.3* 26.7* 24.0* 24.0* 24.7* 25.9* 25.4*  PLT 126* 132* 146*  --   --  198 235 265   Recent Labs    12/30/17 0539 12/31/17 0751 01/01/18 0456 01/01/18 0931 01/01/18 1025 01/02/18 0507 01/03/18 0351  NA 144 146* 144 145 145 145 143  K 4.3 4.5 4.7 4.1 4.2 4.1 4.3  CL 110 108 112*  --   --  109 107  CO2 27 28 29   --   --  30 30  BUN 11 18 17    --   --  21* 17  CREATININE 0.66 0.62 0.57  --   --  0.77 0.65  GLUCOSE 130* 127* 104*  --   --  115* 119*  CALCIUM 7.8* 8.0* 7.3*  --   --  7.7* 8.2*   Lab Results  Component Value Date   INR 1.26 12/26/2017   INR 1.29 12/26/2017   INR 1.29 12/25/2017    Examination:  General appearance: alert, cooperative and no distress Extremities: extremities normal, atraumatic, no cyanosis or edema  Wound Exam: clean, dry, intact   Drainage:  Scant/small amount Serous exudate  Motor Exam: Quadriceps and Hamstrings Intact  Sensory Exam: Superficial Peroneal, Deep Peroneal and Tibial normal   Assessment:    4 Days Post-Op  Procedure(s) (LRB): IRRIGATION AND DEBRIDEMENT EXTREMITY (Right) OPEN REDUCTION INTERNAL FIXATION (ORIF) TIBIAL PLATEAU (Right)  ADDITIONAL DIAGNOSIS:  Active Problems:   Liver laceration, closed, initial encounter   Open fracture of tibial plateau, right, type III, initial encounter   Closed displaced comminuted fracture of shaft of right humerus   Closed displaced fracture of neck of left second metacarpal bone   Closed displaced fracture of neck of left third metacarpal bone   Closed fracture of base of fourth metacarpal bone of left hand   Degloving injury of lower leg,  initial encounter   Pulmonary contusion   Multiple rib fractures     Plan: Physical Therapy as ordered Non Weight Bearing (NWB)  DVT Prophylaxis:  Lovenox  s/p Procedure(s): IRRIGATION AND DEBRIDEMENT EXTREMITY (Right) OPEN REDUCTION INTERNAL FIXATION (ORIF) TIBIAL PLATEAU (Right) recheck wbc and hg today  PHBC Acute hypoxic ventilator dependent respiratory failure- extubated Mult R rib FX with HPTX/pulm contusion-CXR with increase R pleural eff. 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 Franky Macho ABL anemia ID- fever,Pending-CX blood, resp.Empiric vanc/zosyn Thrombocytopenia-  consumptive, improved Open R tib fib- washed out in ED. S/P Ex fix by Dr. Jena Gauss 8/15, S/P I&D and ABX bead placement 8/19 by Dr. Carola Frost, S/P partial ORIF tibia 8/21 by Dr. Jena Gauss. Plan for surgery likely Monday R humerus FX- S/P ORIF by Dr. Carola Frost R second distal phalanx FX- per Dr. Jena Gauss L 2-4 MC FXs- S/P ORIF/CR by Dr. Jena Gauss 8/16 L L2-3 TVP FXs Dispo- ICU  Will continue to follow patient over the weekend. Plan to return to OR next week by dr haddix for removal of ex fix with permanent hardware fixation.          Guy Sandifer 01/05/2018, 7:20 AM
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
## 2018-01-05 NOTE — Progress Notes (Signed)
Patient ID: Valentino Hue, female   DOB: 06-04-1970, 47 y.o.   MRN: [id number]. Follow up - Trauma Critical Care  Patient Details:    Enslie Sahota is an 47 y.o. female.  Lines/tubes : PICC Triple Lumen 12/31/17 PICC Left Cephalic 47 cm 1 cm (Active)  Indication for Insertion or Continuance of Line Prolonged intravenous therapies;Limited venous access - need for IV therapy >5 days (PICC only) 01/04/2018  8:00 PM  Exposed Catheter (cm) 1 cm 12/31/2017 10:57 AM  Site Assessment Clean;Dry;Intact 01/04/2018  8:00 PM  Lumen #1 Status Infusing 01/04/2018  8:00 PM  Lumen #2 Status Infusing 01/04/2018  8:00 PM  Lumen #3 Status In-line blood sampling system in place;Blood return noted;Flushed 01/04/2018  8:00 PM  Dressing Type Transparent;Occlusive 01/04/2018  8:00 PM  Dressing Status Clean;Dry;Intact;Antimicrobial disc in place 01/04/2018  8:00 PM  Line Care Connections checked and tightened;Lumen 1 tubing changed 01/04/2018  8:00 PM  Dressing Change Due 01/07/18 01/04/2018  8:00 PM     Negative Pressure Wound Therapy Knee Anterior;Right (Active)  Site / Wound Assessment Dressing in place / Unable to assess 01/04/2018  8:00 PM  Peri-wound Assessment Other (Comment) 01/04/2018  8:00 AM  Cycle Continuous 01/04/2018  8:00 PM  Target Pressure (mmHg) 125 01/04/2018  8:00 PM  Canister Changed No 01/04/2018  8:00 AM  Dressing Status Intact 01/04/2018  8:00 PM  Drainage Amount None 01/04/2018  8:00 PM  Drainage Description Serosanguineous 01/04/2018  4:00 AM  Output (mL) 0 mL 01/04/2018  7:00 PM     External Urinary Catheter (Active)  Collection Container Dedicated Suction Canister 01/04/2018  8:00 PM  Securement Method Other (Comment) 01/04/2018  8:00 AM  Intervention Equipment Changed 01/05/2018  4:00 AM  Output (mL) 875 mL 01/05/2018  3:00 AM    Microbiology/Sepsis markers: Results for orders placed or performed during the hospital encounter of 12/25/17  Urine culture     Status: None   Collection Time: 12/25/17 10:06 PM  Result Value Ref Range Status   Specimen Description URINE, CATHETERIZED  Final   Special Requests Normal  Final   Culture   Final    NO GROWTH Performed at [facility], [address] [place], [place] [address]    Report Status 12/27/2017 FINAL  Final  MRSA PCR Screening     Status: None   Collection Time: 12/25/17 11:37 PM  Result Value Ref Range Status   MRSA by PCR NEGATIVE NEGATIVE Final    Comment:        The GeneXpert MRSA Assay (FDA approved for NASAL specimens only), is one component of a comprehensive MRSA colonization surveillance program. It is not intended to diagnose MRSA infection nor to guide or monitor treatment for MRSA infections. Performed at [facility], [address] [place], [place] [address]   Surgical PCR screen     Status: None   Collection Time: 12/26/17 10:56 AM  Result Value Ref Range Status   MRSA, PCR NEGATIVE NEGATIVE Final   Staphylococcus aureus NEGATIVE NEGATIVE Final    Comment: (NOTE) The Xpert SA Assay (FDA approved for NASAL specimens in patients 13 years of age and older), is one component of a comprehensive surveillance program. It is not intended to diagnose infection nor to guide or monitor treatment. Performed at [facility], [address], [place], [place] [address]   Culture, blood (Routine X 2) w Reflex to ID Panel     Status: None (Preliminary result)  Collection Time: 01/02/18 10:05 AM  Result Value Ref Range Status   Specimen Description BLOOD RIGHT HAND  Final   Special Requests   Final    BOTTLES DRAWN AEROBIC ONLY Blood Culture adequate volume   Culture   Final    NO GROWTH 2 DAYS Performed at [facility], [address] [place], [place] [address]    Report Status PENDING  Incomplete  Culture, blood (Routine X 2) w Reflex to ID Panel     Status: None (Preliminary result)   Collection Time: 01/02/18 10:18 AM  Result Value Ref Range Status    Specimen Description BLOOD BLOOD RIGHT HAND  Final   Special Requests   Final    BOTTLES DRAWN AEROBIC ONLY Blood Culture adequate volume   Culture   Final    NO GROWTH 2 DAYS Performed at [facility], [address] [place], [place] [address]    Report Status PENDING  Incomplete    Anti-infectives:  Anti-infectives (From admission, onward)   Start     Dose/Rate Route Frequency Ordered Stop   01/02/18 1030  piperacillin-tazobactam (ZOSYN) IVPB 3.375 g     3.375 g 12.5 mL/hr over 240 Minutes Intravenous Every 8 hours 01/02/18 0918     01/02/18 1000  vancomycin (VANCOCIN) IVPB 1000 mg/200 mL premix     1,000 mg 200 mL/hr over 60 Minutes Intravenous Every 8 hours 01/02/18 0927     01/01/18 1029  tobramycin (NEBCIN) powder  Status:  Discontinued       As needed 01/01/18 1029 01/01/18 1139   01/01/18 1029  vancomycin (VANCOCIN) powder  Status:  Discontinued       As needed 01/01/18 1029 01/01/18 1139   01/01/18 0915  ceFAZolin (ANCEF) 3 g in dextrose 5 % 50 mL IVPB  Status:  Discontinued     3 g 160 mL/hr over 30 Minutes Intravenous To Surgery 01/01/18 0908 01/01/18 1155   12/30/17 0929  tobramycin (NEBCIN) powder  Status:  Discontinued       As needed 12/30/17 0929 12/30/17 1057   12/30/17 0929  vancomycin (VANCOCIN) powder  Status:  Discontinued       As needed 12/30/17 0929 12/30/17 1057   12/30/17 0900  ceFAZolin (ANCEF) IVPB 2 g/50 mL premix     2 g 100 mL/hr over 30 Minutes Intravenous  Once 12/30/17 0851 12/30/17 0858   12/27/17 1056  vancomycin (VANCOCIN) powder  Status:  Discontinued       As needed 12/27/17 1056 12/27/17 1212   12/27/17 1000  ceFAZolin (ANCEF) IVPB 1 g/50 mL premix     1 g 100 mL/hr over 30 Minutes Intravenous  Once 12/27/17 1000 01/01/18 0909   12/26/17 2000  cefTRIAXone (ROCEPHIN) 2 g in sodium chloride 0.9 % 100 mL IVPB  Status:  Discontinued     2 g 200 mL/hr over 30 Minutes Intravenous Every 24 hours 12/26/17 1857 01/02/18 0918   12/26/17  1540  vancomycin (VANCOCIN) powder  Status:  Discontinued       As needed 12/26/17 1542 12/26/17 1656   12/26/17 1539  tobramycin (NEBCIN) powder  Status:  Discontinued       As needed 12/26/17 1539 12/26/17 1656   12/26/17 1200  ceFAZolin (ANCEF) IVPB 2g/100 mL premix  Status:  Discontinued     2 g 200 mL/hr over 30 Minutes Intravenous Every 8 hours 12/26/17 0640 12/26/17 1857   12/26/17 0400  ceFAZolin (ANCEF) IVPB 1 g/50 mL  premix  Status:  Discontinued     1 g 100 mL/hr over 30 Minutes Intravenous Every 8 hours 12/25/17 2140 12/26/17 0640   12/25/17 1907  ceFAZolin (ANCEF) 2-4 GM/100ML-% IVPB    Note to Pharmacy:  Lysle Pearl   : cabinet override      12/25/17 1907 12/25/17 2030      Best Practice/Protocols:  VTE Prophylaxis: Lovenox (prophylaxtic dose) and Mechanical GI Prophylaxis: Proton Pump Inhibitor n/a  Consults: Treatment Team:  Roby Lofts, MD    Studies:    Events:  Subjective:    Overnight Issues:  None; pt tolerated clears. Denies n/v/choking/gagging/dysphagia. Wants C collar off. Denies neck pain.  Objective:  Vital signs for last 24 hours: Temp:  [97.8 F (36.6 C)-99.3 F (37.4 C)] 97.8 F (36.6 C) (08/25 0400) Pulse Rate:  [33-109] 92 (08/25 0700) Resp:  [20-41] 31 (08/25 0700) BP: (92-172)/(40-131) 117/46 (08/25 0700) SpO2:  [93 %-100 %] 100 % (08/25 0700) Weight:  [129.4 kg] 129.4 kg (08/25 0600)  Hemodynamic parameters for last 24 hours:    Intake/Output from previous day: 08/24 0701 - 08/25 0700 In: 2155 [P.O.:120; I.V.:1150.6; IV Piggyback:884.4] Out: 2925 [Urine:2925]  Intake/Output this shift: No intake/output data recorded.  Vent settings for last 24 hours:    Physical Exam:  General appearance: alert and cooperative Neck: in C collar; denies neck pain; no TTP; FROM without pain/tenderness Resp: slightly decreased on right. Work of breathing ok.  Cardio: regular rate and rhythm GI: soft, non-tender; bowel sounds  normal; no masses,  no organomegaly Extremities: warm; ext fix RLE; some edema in LLE.  Results for orders placed or performed during the hospital encounter of 12/25/17 (from the past 24 hour(s))  Glucose, capillary     Status: Abnormal   Collection Time: 01/04/18  8:48 AM  Result Value Ref Range   Glucose-Capillary 102 (H) 70 - 99 mg/dL  CBC with Differential/Platelet     Status: Abnormal   Collection Time: 01/04/18 10:09 AM  Result Value Ref Range   WBC 17.7 (H) 4.0 - 10.5 K/uL   RBC 2.72 (L) 3.87 - 5.11 MIL/uL   Hemoglobin 8.0 (L) 12.0 - 15.0 g/dL   HCT 16.1 (L) 09.6 - 04.5 %   MCV 93.4 78.0 - 100.0 fL   MCH 29.4 26.0 - 34.0 pg   MCHC 31.5 30.0 - 36.0 g/dL   RDW 40.9 81.1 - 91.4 %   Platelets 265 150 - 400 K/uL   Neutrophils Relative % 80 %   Neutro Abs 14.1 (H) 1.7 - 7.7 K/uL   Lymphocytes Relative 12 %   Lymphs Abs 2.1 0.7 - 4.0 K/uL   Monocytes Relative 5 %   Monocytes Absolute 0.9 0.1 - 1.0 K/uL   Eosinophils Relative 2 %   Eosinophils Absolute 0.4 0.0 - 0.7 K/uL   Basophils Relative 0 %   Basophils Absolute 0.1 0.0 - 0.1 K/uL   Immature Granulocytes 1 %   Abs Immature Granulocytes 0.2 (H) 0.0 - 0.1 K/uL  Glucose, capillary     Status: Abnormal   Collection Time: 01/04/18 12:42 PM  Result Value Ref Range   Glucose-Capillary 108 (H) 70 - 99 mg/dL  Glucose, capillary     Status: None   Collection Time: 01/04/18  5:01 PM  Result Value Ref Range   Glucose-Capillary 89 70 - 99 mg/dL  Glucose, capillary     Status: Abnormal   Collection Time: 01/04/18  7:54 PM  Result Value Ref Range  Glucose-Capillary 131 (H) 70 - 99 mg/dL  Glucose, capillary     Status: None   Collection Time: 01/04/18 11:56 PM  Result Value Ref Range   Glucose-Capillary 91 70 - 99 mg/dL  Glucose, capillary     Status: Abnormal   Collection Time: 01/05/18  3:57 AM  Result Value Ref Range   Glucose-Capillary 124 (H) 70 - 99 mg/dL  Glucose, capillary     Status: Abnormal   Collection Time:  01/05/18  7:39 AM  Result Value Ref Range   Glucose-Capillary 105 (H) 70 - 99 mg/dL    Assessment & Plan: Present on Admission: . Liver laceration, closed, initial encounter . Open fracture of tibial plateau, right, type III, initial encounter . Closed displaced comminuted fracture of shaft of right humerus  PHBC Acute hypoxic ventilator dependent respiratory failure- extubated Mult R rib FX with HPTX/pulm contusion-CXR with increase R pleural eff. Getting thoracentesis today Grade 4 liver lac - hgb stable.  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 Franky Macho; d/c c spine precautions 8/25 ABL anemia- hgb stable; repeat T&C since has OR 8/26 ID- fever,Pending-CX blood, resp.Empiric vanc/zosyn; bld cx NTD. If no fever today-consider stopping in AM Thrombocytopenia- consumptive, improved Open R tib fib- washed out in ED. S/P Ex fix by Dr. Jena Gauss 8/15, S/P I&D and ABX bead placement 8/19 by Dr. Carola Frost, S/P partial ORIF tibia 8/21 by Dr. Jena Gauss. Plan for surgery likely Monday R humerus FX- S/P ORIF by Dr. Carola Frost R second distal phalanx FX- per Dr. Jena Gauss L 2-4 MC FXs- S/P ORIF/CR by Dr. Jena Gauss 8/16 L L2-3 TVP FXs FEN-speech eval rec crushed meds only; started on CLD 8/24; pt fully awake and conversive today; tolerated clears; adv to FLD; NPO except meds at [place] for OR 8/26 Endo- blood sugars stable.  VTE- Lovenox Dispo- ICU  LOS: 11 days   Additional comments:I reviewed the patient's new clinical lab test results. , I reviewed the patients new imaging test results and I have discussed and reviewed with family members patient's family  Critical Care Total Time*: 56 Minutes  Mary Sella. Andrey Campanile, MD, FACS General, Bariatric, & Minimally Invasive Surgery [facility], [place]   01/05/2018  *Care during the described time interval was provided by me. I have reviewed this patient's available data, including medical history, events of note, physical examination  and test results as part of my evaluation.

## 2018-01-06 ENCOUNTER — Inpatient Hospital Stay (HOSPITAL_COMMUNITY): Payer: No Typology Code available for payment source | Admitting: Certified Registered"

## 2018-01-06 ENCOUNTER — Encounter (HOSPITAL_COMMUNITY): Admission: EM | Disposition: A | Discharge status: Rehabilitation Facility | Payer: Self-pay | Source: Home / Self Care

## 2018-01-06 ENCOUNTER — Encounter (HOSPITAL_COMMUNITY): Payer: Self-pay | Admitting: Orthopedic Surgery

## 2018-01-06 ENCOUNTER — Inpatient Hospital Stay (HOSPITAL_COMMUNITY): Payer: No Typology Code available for payment source

## 2018-01-06 HISTORY — PX: ORIF TIBIA PLATEAU: SHX2132

## 2018-01-06 LAB — BASIC METABOLIC PANEL
ANION GAP: 10 (ref 5–15)
BUN: 12 mg/dL (ref 6–20)
CALCIUM: 8.3 mg/dL — AB (ref 8.9–10.3)
CO2: 23 mmol/L (ref 22–32)
CREATININE: 0.8 mg/dL (ref 0.44–1.00)
Chloride: 103 mmol/L (ref 98–111)
GFR calc Af Amer: >60 mL/min (ref >60)
Glucose, Bld: 107 mg/dL — ABNORMAL HIGH (ref 70–99)
Potassium: 4.2 mmol/L (ref 3.5–5.1)
SODIUM: 136 mmol/L (ref 135–145)

## 2018-01-06 LAB — GLUCOSE, CAPILLARY
GLUCOSE-CAPILLARY: 107 mg/dL — AB (ref 70–99)
GLUCOSE-CAPILLARY: 112 mg/dL — AB (ref 70–99)
Glucose-Capillary: 112 mg/dL — ABNORMAL HIGH (ref 70–99)
Glucose-Capillary: 117 mg/dL — ABNORMAL HIGH (ref 70–99)
Glucose-Capillary: 130 mg/dL — ABNORMAL HIGH (ref 70–99)

## 2018-01-06 LAB — CBC
HEMATOCRIT: 25.7 % — AB (ref 36.0–46.0)
HEMOGLOBIN: 8.1 g/dL — AB (ref 12.0–15.0)
MCH: 29.5 pg (ref 26.0–34.0)
MCHC: 31.5 g/dL (ref 30.0–36.0)
MCV: 93.5 fL (ref 78.0–100.0)
Platelets: 296 10*3/uL (ref 150–400)
RBC: 2.75 MIL/uL — AB (ref 3.87–5.11)
RDW: 14.9 % (ref 11.5–15.5)
WBC: 17.9 10*3/uL — ABNORMAL HIGH (ref 4.0–10.5)

## 2018-01-06 LAB — TYPE AND SCREEN
ABO/RH(D): O POS
ANTIBODY SCREEN: NEGATIVE

## 2018-01-06 SURGERY — OPEN REDUCTION INTERNAL FIXATION (ORIF) TIBIAL PLATEAU
Anesthesia: General | Laterality: Right

## 2018-01-06 MED ORDER — MIDAZOLAM HCL 5 MG/5ML IJ SOLN
INTRAMUSCULAR | Status: DC | PRN
  Administered 2018-01-06: 2 mg via INTRAVENOUS

## 2018-01-06 MED ORDER — METOPROLOL TARTRATE 12.5 MG HALF TABLET
ORAL_TABLET | ORAL | Status: AC
  Filled 2018-01-06: qty 2

## 2018-01-06 MED ORDER — PROMETHAZINE HCL 25 MG/ML IJ SOLN: 6.2500 mg | INTRAMUSCULAR | Status: DC | PRN

## 2018-01-06 MED ORDER — FENTANYL CITRATE (PF) 100 MCG/2ML IJ SOLN
INTRAMUSCULAR | Status: DC | PRN
  Administered 2018-01-06: 100 ug via INTRAVENOUS
  Administered 2018-01-06 (×4): 50 ug via INTRAVENOUS

## 2018-01-06 MED ORDER — VANCOMYCIN HCL 1000 MG IV SOLR
INTRAVENOUS | Status: DC | PRN
  Administered 2018-01-06: 2 g via TOPICAL

## 2018-01-06 MED ORDER — ONDANSETRON HCL 4 MG/2ML IJ SOLN
INTRAMUSCULAR | Status: AC
  Filled 2018-01-06: qty 2

## 2018-01-06 MED ORDER — VANCOMYCIN HCL 1000 MG IV SOLR
INTRAVENOUS | Status: AC
  Filled 2018-01-06: qty 2000

## 2018-01-06 MED ORDER — SODIUM CHLORIDE 0.9 % IR SOLN
Status: DC | PRN
  Administered 2018-01-06: 3000 mL

## 2018-01-06 MED ORDER — FENTANYL CITRATE (PF) 250 MCG/5ML IJ SOLN
INTRAMUSCULAR | Status: AC
  Filled 2018-01-06: qty 5

## 2018-01-06 MED ORDER — LACTATED RINGERS IV SOLN: INTRAVENOUS | Status: DC

## 2018-01-06 MED ORDER — LACTATED RINGERS IV SOLN
INTRAVENOUS | Status: DC
  Administered 2018-01-06: 09:00:00 via INTRAVENOUS

## 2018-01-06 MED ORDER — DEXAMETHASONE SODIUM PHOSPHATE 10 MG/ML IJ SOLN
INTRAMUSCULAR | Status: AC
  Filled 2018-01-06: qty 1

## 2018-01-06 MED ORDER — PROPOFOL 10 MG/ML IV BOLUS
INTRAVENOUS | Status: AC
  Filled 2018-01-06: qty 20

## 2018-01-06 MED ORDER — DEXTROSE 5 % IV SOLN
3.0000 g | Freq: Once | INTRAVENOUS | Status: AC
  Administered 2018-01-06: 3 g via INTRAVENOUS
  Filled 2018-01-06: qty 3

## 2018-01-06 MED ORDER — PROPOFOL 10 MG/ML IV BOLUS
INTRAVENOUS | Status: DC | PRN
  Administered 2018-01-06: 160 mg via INTRAVENOUS

## 2018-01-06 MED ORDER — PHENYLEPHRINE 40 MCG/ML (10ML) SYRINGE FOR IV PUSH (FOR BLOOD PRESSURE SUPPORT)
PREFILLED_SYRINGE | INTRAVENOUS | Status: AC
  Filled 2018-01-06: qty 10

## 2018-01-06 MED ORDER — SUGAMMADEX SODIUM 200 MG/2ML IV SOLN
INTRAVENOUS | Status: DC | PRN
  Administered 2018-01-06: 200 mg via INTRAVENOUS

## 2018-01-06 MED ORDER — HYDROMORPHONE HCL 1 MG/ML IJ SOLN: 0.2500 mg | INTRAMUSCULAR | Status: DC | PRN

## 2018-01-06 MED ORDER — LIDOCAINE 2% (20 MG/ML) 5 ML SYRINGE
INTRAMUSCULAR | Status: DC | PRN
  Administered 2018-01-06: 100 mg via INTRAVENOUS

## 2018-01-06 MED ORDER — ONDANSETRON HCL 4 MG/2ML IJ SOLN
INTRAMUSCULAR | Status: DC | PRN
  Administered 2018-01-06: 4 mg via INTRAVENOUS

## 2018-01-06 MED ORDER — MIDAZOLAM HCL 2 MG/2ML IJ SOLN
INTRAMUSCULAR | Status: AC
  Filled 2018-01-06: qty 2

## 2018-01-06 MED ORDER — MEPERIDINE HCL 50 MG/ML IJ SOLN: 6.2500 mg | INTRAMUSCULAR | Status: DC | PRN

## 2018-01-06 MED ORDER — ROCURONIUM BROMIDE 50 MG/5ML IV SOSY
PREFILLED_SYRINGE | INTRAVENOUS | Status: AC
  Filled 2018-01-06: qty 5

## 2018-01-06 MED ORDER — CEFAZOLIN SODIUM-DEXTROSE 2-4 GM/100ML-% IV SOLN
INTRAVENOUS | Status: AC
  Filled 2018-01-06: qty 100

## 2018-01-06 MED ORDER — DEXAMETHASONE SODIUM PHOSPHATE 10 MG/ML IJ SOLN
INTRAMUSCULAR | Status: DC | PRN
  Administered 2018-01-06: 4 mg via INTRAVENOUS

## 2018-01-06 MED ORDER — ROCURONIUM BROMIDE 10 MG/ML (PF) SYRINGE
PREFILLED_SYRINGE | INTRAVENOUS | Status: DC | PRN
  Administered 2018-01-06: 40 mg via INTRAVENOUS
  Administered 2018-01-06 (×2): 10 mg via INTRAVENOUS

## 2018-01-06 MED ORDER — TOBRAMYCIN SULFATE 1.2 G IJ SOLR
INTRAMUSCULAR | Status: AC
  Filled 2018-01-06: qty 1.2

## 2018-01-06 MED ORDER — PHENYLEPHRINE 40 MCG/ML (10ML) SYRINGE FOR IV PUSH (FOR BLOOD PRESSURE SUPPORT)
PREFILLED_SYRINGE | INTRAVENOUS | Status: DC | PRN
  Administered 2018-01-06: 80 ug via INTRAVENOUS
  Administered 2018-01-06: 120 ug via INTRAVENOUS
  Administered 2018-01-06: 80 ug via INTRAVENOUS

## 2018-01-06 MED ORDER — LIDOCAINE 2% (20 MG/ML) 5 ML SYRINGE
INTRAMUSCULAR | Status: AC
  Filled 2018-01-06: qty 5

## 2018-01-06 MED ORDER — TOBRAMYCIN SULFATE 1.2 G IJ SOLR
INTRAMUSCULAR | Status: DC | PRN
  Administered 2018-01-06: 1.2 g via TOPICAL

## 2018-01-06 MED ORDER — 0.9 % SODIUM CHLORIDE (POUR BTL) OPTIME
TOPICAL | Status: DC | PRN
  Administered 2018-01-06: 1000 mL

## 2018-01-06 SURGICAL SUPPLY — 83 items
BANDAGE ACE 4X5 VEL STRL LF (GAUZE/BANDAGES/DRESSINGS) ×3 IMPLANT
BANDAGE ACE 6X5 VEL STRL LF (GAUZE/BANDAGES/DRESSINGS) ×3 IMPLANT
BANDAGE ESMARK 6X9 LF (GAUZE/BANDAGES/DRESSINGS) ×1 IMPLANT
BIT DRILL 2.5 X LONG (BIT) ×1
BIT DRILL PERC QC 2.8X200 100 (BIT) ×1 IMPLANT
BIT DRILL X LONG 2.5 (BIT) ×1 IMPLANT
BLADE CLIPPER SURG (BLADE) IMPLANT
BLADE SURG 15 STRL LF DISP TIS (BLADE) ×1 IMPLANT
BLADE SURG 15 STRL SS (BLADE) ×2
BNDG ELASTIC 6X15 VLCR STRL LF (GAUZE/BANDAGES/DRESSINGS) ×3 IMPLANT
BNDG ESMARK 6X9 LF (GAUZE/BANDAGES/DRESSINGS) ×3
BNDG GAUZE ELAST 4 BULKY (GAUZE/BANDAGES/DRESSINGS) ×3 IMPLANT
BRUSH SCRUB SURG 4.25 DISP (MISCELLANEOUS) ×6 IMPLANT
CANISTER SUCT 3000ML PPV (MISCELLANEOUS) ×3 IMPLANT
CANISTER WOUND CARE 500ML ATS (WOUND CARE) ×3 IMPLANT
CHLORAPREP W/TINT 26ML (MISCELLANEOUS) ×6 IMPLANT
COVER SURGICAL LIGHT HANDLE (MISCELLANEOUS) ×3 IMPLANT
CUFF TOURNIQUET SINGLE 34IN LL (TOURNIQUET CUFF) IMPLANT
DRAPE C-ARM 42X72 X-RAY (DRAPES) ×3 IMPLANT
DRAPE C-ARMOR (DRAPES) ×3 IMPLANT
DRAPE INCISE IOBAN 66X45 STRL (DRAPES) ×3 IMPLANT
DRAPE ORTHO SPLIT 77X108 STRL (DRAPES) ×4
DRAPE SURG ORHT 6 SPLT 77X108 (DRAPES) ×2 IMPLANT
DRAPE U-SHAPE 47X51 STRL (DRAPES) ×3 IMPLANT
DRILL BIT QUICK COUP 2.8MM 100 (BIT) ×2
DRILL BIT X LONG 2.5 (BIT) ×2
DRSG MEPITEL 4X7.2 (GAUZE/BANDAGES/DRESSINGS) ×6 IMPLANT
DRSG PAD ABDOMINAL 8X10 ST (GAUZE/BANDAGES/DRESSINGS) ×6 IMPLANT
DRSG VAC ATS LRG SENSATRAC (GAUZE/BANDAGES/DRESSINGS) ×3 IMPLANT
ELECT REM PT RETURN 9FT ADLT (ELECTROSURGICAL) ×3
ELECTRODE REM PT RTRN 9FT ADLT (ELECTROSURGICAL) ×1 IMPLANT
GAUZE SPONGE 4X4 12PLY STRL (GAUZE/BANDAGES/DRESSINGS) ×3 IMPLANT
GLOVE BIO SURGEON STRL SZ7.5 (GLOVE) ×12 IMPLANT
GLOVE BIOGEL PI IND STRL 7.5 (GLOVE) ×1 IMPLANT
GLOVE BIOGEL PI INDICATOR 7.5 (GLOVE) ×2
GOWN STRL REUS W/ TWL LRG LVL3 (GOWN DISPOSABLE) ×2 IMPLANT
GOWN STRL REUS W/TWL LRG LVL3 (GOWN DISPOSABLE) ×4
HANDPIECE INTERPULSE COAX TIP (DISPOSABLE) ×2
IMMOBILIZER KNEE 22 UNIV (SOFTGOODS) ×3 IMPLANT
K-WIRE 1.6X150 (WIRE) ×3
KIT BASIN OR (CUSTOM PROCEDURE TRAY) ×3 IMPLANT
KIT TURNOVER KIT B (KITS) ×3 IMPLANT
KWIRE 1.6X150 (WIRE) ×1 IMPLANT
MANIFOLD NEPTUNE WASTE (CANNULA) ×3 IMPLANT
NDL SUT 6 .5 CRC .975X.05 MAYO (NEEDLE) ×1 IMPLANT
NEEDLE MAYO TAPER (NEEDLE) ×2
NS IRRIG 1000ML POUR BTL (IV SOLUTION) ×3 IMPLANT
PACK TOTAL JOINT (CUSTOM PROCEDURE TRAY) ×3 IMPLANT
PAD ARMBOARD 7.5X6 YLW CONV (MISCELLANEOUS) ×6 IMPLANT
PAD CAST 4YDX4 CTTN HI CHSV (CAST SUPPLIES) ×1 IMPLANT
PADDING CAST COTTON 4X4 STRL (CAST SUPPLIES) ×2
PADDING CAST COTTON 6X4 STRL (CAST SUPPLIES) ×3 IMPLANT
PROS LCP PLATE 12 163M (Plate) ×3 IMPLANT
PROSTHESIS LCP PLATE 12 163M (Plate) ×1 IMPLANT
SCREW CORTEX 3.5 26MM (Screw) ×2 IMPLANT
SCREW CORTEX 3.5 28MM (Screw) ×4 IMPLANT
SCREW LOCK CORT ST 3.5X26 (Screw) ×1 IMPLANT
SCREW LOCK CORT ST 3.5X28 (Screw) ×2 IMPLANT
SCREW LOCK T15 FT 22X3.5XST (Screw) ×1 IMPLANT
SCREW LOCK T15 FT 36X3.5X2.9X (Screw) ×1 IMPLANT
SCREW LOCKING 3.5X22 (Screw) ×2 IMPLANT
SCREW LOCKING 3.5X36 (Screw) ×2 IMPLANT
SCREW LOCKING 3.5X50 (Screw) ×6 IMPLANT
SCREW LOCKING 3.5X52MM (Screw) ×3 IMPLANT
SET HNDPC FAN SPRY TIP SCT (DISPOSABLE) ×1 IMPLANT
STAPLER VISISTAT 35W (STAPLE) ×3 IMPLANT
SUCTION FRAZIER HANDLE 10FR (MISCELLANEOUS) ×2
SUCTION TUBE FRAZIER 10FR DISP (MISCELLANEOUS) ×1 IMPLANT
SUT ETHILON 2 0 FS 18 (SUTURE) ×3 IMPLANT
SUT ETHILON 3 0 PS 1 (SUTURE) IMPLANT
SUT FIBERWIRE #2 38 T-5 BLUE (SUTURE)
SUT VIC AB 0 CT1 27 (SUTURE)
SUT VIC AB 0 CT1 27XBRD ANBCTR (SUTURE) IMPLANT
SUT VIC AB 1 CT1 18XCR BRD 8 (SUTURE) IMPLANT
SUT VIC AB 1 CT1 27 (SUTURE) ×2
SUT VIC AB 1 CT1 27XBRD ANBCTR (SUTURE) ×1 IMPLANT
SUT VIC AB 1 CT1 8-18 (SUTURE)
SUT VIC AB 2-0 CT1 27 (SUTURE) ×4
SUT VIC AB 2-0 CT1 TAPERPNT 27 (SUTURE) ×2 IMPLANT
SUTURE FIBERWR #2 38 T-5 BLUE (SUTURE) IMPLANT
TOWEL OR 17X26 10 PK STRL BLUE (TOWEL DISPOSABLE) ×6 IMPLANT
TRAY FOLEY MTR SLVR 16FR STAT (SET/KITS/TRAYS/PACK) IMPLANT
WATER STERILE IRR 1000ML POUR (IV SOLUTION) ×6 IMPLANT

## 2018-01-06 NOTE — Interval H&P Note (Signed)
History and Physical Interval Note:  01/06/2018 8:51 AM  Dorothy HueMarilyn Lezlie Wilson  has presented today for surgery, with the diagnosis of Right tibial plateau fracture  The various methods of treatment have been discussed with the patient and family. After consideration of risks, benefits and other options for treatment, the patient has consented to  Procedure(s): OPEN REDUCTION INTERNAL FIXATION (ORIF) TIBIAL PLATEAU (Right) as a surgical intervention .  The patient's history has been reviewed, patient examined, no change in status, stable for surgery.  I have reviewed the patient's chart and labs.  Questions were answered to the patient's satisfaction.     Caryn BeeKevin P Haddix

## 2018-01-06 NOTE — Anesthesia Postprocedure Evaluation (Signed)
Anesthesia Post Note  Patient: Dorothy Wilson  Procedure(s) Performed: OPEN REDUCTION INTERNAL FIXATION (ORIF) TIBIAL PLATEAU (Right )     Patient location during evaluation: PACU Anesthesia Type: General Level of consciousness: awake and alert Pain management: pain level controlled Vital Signs Assessment: post-procedure vital signs reviewed and stable Respiratory status: spontaneous breathing, nonlabored ventilation, respiratory function stable and patient connected to nasal cannula oxygen Cardiovascular status: blood pressure returned to baseline and stable Postop Assessment: no apparent nausea or vomiting Anesthetic complications: no    Last Vitals:  Vitals:   01/06/18 1400 01/06/18 1443  BP: 129/73   Pulse: 99   Resp: (!) 23   Temp:    SpO2: 95% 99%    Last Pain:  Vitals:   01/06/18 1250  TempSrc:   PainSc: 4                  Shelton SilvasKevin D Lene Mckay

## 2018-01-06 NOTE — Anesthesia Preprocedure Evaluation (Addendum)
Anesthesia Evaluation  Patient identified by MRN, date of birth, ID band Patient awake    Reviewed: Allergy & Precautions, NPO status , Patient's Chart, lab work & pertinent test results  Airway Mallampati: I  TM Distance: >3 FB Neck ROM: Full    Dental  (+) Teeth Intact, Dental Advisory Given   Pulmonary neg pulmonary ROS,    breath sounds clear to auscultation       Cardiovascular negative cardio ROS   Rhythm:Regular Rate:Normal     Neuro/Psych negative neurological ROS  negative psych ROS   GI/Hepatic negative GI ROS, Neg liver ROS,   Endo/Other  negative endocrine ROS  Renal/GU negative Renal ROS     Musculoskeletal negative musculoskeletal ROS (+)   Abdominal (+) + obese,   Peds  Hematology negative hematology ROS (+)   Anesthesia Other Findings   Reproductive/Obstetrics                            Anesthesia Physical Anesthesia Plan  ASA: III  Anesthesia Plan: General   Post-op Pain Management:    Induction: Intravenous  PONV Risk Score and Plan: 4 or greater and Ondansetron, Dexamethasone, Midazolam and Scopolamine patch - Pre-op  Airway Management Planned: Oral ETT  Additional Equipment: None  Intra-op Plan:   Post-operative Plan: Extubation in OR  Informed Consent: I have reviewed the patients History and Physical, chart, labs and discussed the procedure including the risks, benefits and alternatives for the proposed anesthesia with the patient or authorized representative who has indicated his/her understanding and acceptance.   Dental advisory given  Plan Discussed with: CRNA  Anesthesia Plan Comments:        Anesthesia Quick Evaluation
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
## 2018-01-06 NOTE — Transfer of Care (Signed)
Immediate Anesthesia Transfer of Care Note  Patient: Dorothy Wilson  Procedure(s) Performed: OPEN REDUCTION INTERNAL FIXATION (ORIF) TIBIAL PLATEAU (Right )  Patient Location: PACU  Anesthesia Type:General  Level of Consciousness: awake  Airway & Oxygen Therapy: Patient Spontanous Breathing and Patient connected to nasal cannula oxygen  Post-op Assessment: Report given to RN, Post -op Vital signs reviewed and stable and Patient moving all extremities  Post vital signs: Reviewed and stable  Last Vitals:  Vitals Value Taken Time  BP 127/73 01/06/2018 11:40 AM  Temp    Pulse 95 01/06/2018 11:42 AM  Resp 29 01/06/2018 11:42 AM  SpO2 97 % 01/06/2018 11:42 AM  Vitals shown include unvalidated device data.  Last Pain:  Vitals:   01/06/18 0800  TempSrc: Oral  PainSc: 0-No pain         Complications: No apparent anesthesia complications

## 2018-01-06 NOTE — Progress Notes (Signed)
Trauma Service Note  Subjective: Patient doing fine after surgery earlier today.  No acute distress.  Objective: Vital signs in last 24 hours: Temp:  [97.5 F (36.4 C)-99.3 F (37.4 C)] 97.9 F (36.6 C) (08/26 1230) Pulse Rate:  [76-101] 93 (08/26 1230) Resp:  [15-35] 24 (08/26 1230) BP: (102-154)/(39-82) 102/57 (08/26 1225) SpO2:  [94 %-100 %] 99 % (08/26 1230) Weight:  [129.4 kg] 129.4 kg (08/26 0838) Last BM Date: 01/05/18  Intake/Output from previous day: 08/25 0701 - 08/26 0700 In: 2238.8 [P.O.:480; I.V.:951; IV Piggyback:807.8] Out: 3850 [Urine:3850] Intake/Output this shift: Total I/O In: 1073.5 [I.V.:1063; IV Piggyback:10.5] Out: 850 [Urine:700; Blood:150]  General: No distress  Lungs: Clear.  Oxygenating well.  Abd: Benign  Extremities: Splinted as per orthopedics.  Neuro: Intact  Lab Results: CBC  Recent Labs    01/04/18 1009 01/06/18 0344  WBC 17.7* 17.9*  HGB 8.0* 8.1*  HCT 25.4* 25.7*  PLT 265 296   BMET Recent Labs    01/06/18 0344  NA 136  K 4.2  CL 103  CO2 23  GLUCOSE 107*  BUN 12  CREATININE 0.80  CALCIUM 8.3*   PT/INR No results for input(s): LABPROT, INR in the last 72 hours. ABG No results for input(s): PHART, HCO3 in the last 72 hours.  Invalid input(s): PCO2, PO2  Studies/Results: Dg Chest 1 View  Result Date: 01/05/2018 CLINICAL DATA:  Status post right thoracentesis for pleural effusion EXAM: CHEST  1 VIEW COMPARISON:  01/05/2018 at 5:21 a.m. FINDINGS: Low lung volumes are present, causing crowding of the pulmonary vasculature. Reduced size of the pleural thickening indicating improved pleural effusion. No visible pneumothorax. Mildly elevated right hemidiaphragm. Multiple right rib deformities compatible with fractures. Left PICC line tip: Right atrium. Mild cardiomegaly.  The left lung appears grossly clear. IMPRESSION: 1. No appreciable pneumothorax following thoracentesis. 2. Low lung volumes. 3. Right rib deformities  compatible with fractures. 4. Mild cardiomegaly. Electronically Signed   By: Gaylyn Rong M.D.   On: 01/05/2018 10:07   Dg Chest Port 1 View  Result Date: 01/05/2018 CLINICAL DATA:  Followup chest trauma EXAM: PORTABLE CHEST 1 VIEW COMPARISON:  01/04/2018 FINDINGS: Left arm PICC tip is at the SVC RA junction. Left chest is clear except for mild basilar atelectasis. Multiple rib fractures again seen on the right, with pleural fluid and moderate atelectasis at the right lung. Similar appearance to yesterday's exam. IMPRESSION: Similar to yesterday. Multiple right rib fractures. Right pleural fluid. Moderate atelectasis of the right lung. Electronically Signed   By: Paulina Fusi M.D.   On: 01/05/2018 07:24   Dg Knee Complete 4 Views Right  Result Date: 01/06/2018 CLINICAL DATA:  ORIF of right tibial fracture EXAM: RIGHT KNEE - COMPLETE 4+ VIEW; DG C-ARM 61-120 MIN COMPARISON:  01/01/2018 FLUOROSCOPY TIME:  Fluoroscopy Time:  1 minutes 33 seconds Radiation Exposure Index (if provided by the fluoroscopic device): Not available Number of Acquired Spot Images: 7 FINDINGS: Fixation sideplate is again identified along the proximal tibia. Proximal tibial and fibular fractures are seen. Large bony fragment from the proximal tibial shaft is noted displaced medially. This was subsequently reduced adjacent to the proximal tibia. A medial fixation sideplate with multiple screws was then placed. The fracture fragments are in near anatomic alignment. IMPRESSION: Status post reduction and internal fixation of proximal tibial fracture Electronically Signed   By: Alcide Clever M.D.   On: 01/06/2018 11:07   Dg Knee Right Port  Result Date: 01/06/2018 CLINICAL DATA:  S/P ORIF Tibial plateau - Right Prior ORIF right tibia plateau 01/01/2018. External fixation right leg 12/26/2017 EXAM: PORTABLE RIGHT KNEE - 1-2 VIEW COMPARISON:  01/06/2018 FINDINGS: Status post ORIF of the proximal tibia with MEDIAL screw plate.  Pre-existing RIGHT LATERAL screw plate appear stable. Large bone fragment MEDIAL to the proximal tibia has been reduced. IMPRESSION: Staged ORIF of the proximal tibia. Electronically Signed   By: Norva PavlovElizabeth  Brown M.D.   On: 01/06/2018 12:47   Dg C-arm 1-60 Min  Result Date: 01/06/2018 CLINICAL DATA:  ORIF of right tibial fracture EXAM: RIGHT KNEE - COMPLETE 4+ VIEW; DG C-ARM 61-120 MIN COMPARISON:  01/01/2018 FLUOROSCOPY TIME:  Fluoroscopy Time:  1 minutes 33 seconds Radiation Exposure Index (if provided by the fluoroscopic device): Not available Number of Acquired Spot Images: 7 FINDINGS: Fixation sideplate is again identified along the proximal tibia. Proximal tibial and fibular fractures are seen. Large bony fragment from the proximal tibial shaft is noted displaced medially. This was subsequently reduced adjacent to the proximal tibia. A medial fixation sideplate with multiple screws was then placed. The fracture fragments are in near anatomic alignment. IMPRESSION: Status post reduction and internal fixation of proximal tibial fracture Electronically Signed   By: Alcide CleverMark  Lukens M.D.   On: 01/06/2018 11:07   [place] Thoracentesis Asp Pleural Space W/img Guide  Result Date: 01/05/2018 INDICATION: Symptomatic right sided pleural effusion EXAM: US THORACENTESIS ASP PLEURAL SPACE W/IMG GUIDE COMPARISON:  None. MEDICATIONS: 10 cc 1% lidocaine. COMPLICATIONS: None immediate. TECHNIQUE: Informed written consent was obtained from the patient after a discussion of the risks, benefits and alternatives to treatment. A timeout was performed prior to the initiation of the procedure. Initial ultrasound scanning demonstrates a right pleural effusion. The lower chest was prepped and draped in the usual sterile fashion. 1% lidocaine was used for local anesthesia. Under direct ultrasound guidance, a 19 gauge, 7-cm, Yueh catheter was introduced. An ultrasound image was saved for documentation purposes. The thoracentesis was  performed. The catheter was removed and a dressing was applied. The patient tolerated the procedure well without immediate post procedural complication. The patient was escorted to have an upright chest radiograph. FINDINGS: A total of approximately 1 liter of bloody fluid was removed. Samples were saved for labs if requested by MD. IMPRESSION: Successful ultrasound-guided right sided thoracentesis yielding 1 liter of pleural fluid. Read by Robet LeuPamela A Turpin [facility] Electronically Signed   By: Jolaine ClickArthur  Hoss M.D.   On: 01/05/2018 09:51    Anti-infectives: Anti-infectives (From admission, onward)   Start     Dose/Rate Route Frequency Ordered Stop   01/06/18 1204  tobramycin (NEBCIN) powder  Status:  Discontinued       As needed 01/06/18 1204 01/06/18 1244   01/06/18 1203  vancomycin (VANCOCIN) powder  Status:  Discontinued       As needed 01/06/18 1203 01/06/18 1244   01/06/18 0900  ceFAZolin (ANCEF) 3 g in dextrose 5 % 50 mL IVPB     3 g 100 mL/hr over 30 Minutes Intravenous  Once 01/06/18 0845 01/06/18 0904   01/06/18 0847  ceFAZolin (ANCEF) 2-4 GM/100ML-% IVPB    Note to Pharmacy:  Dia CrawfordGallman, Kathie   : cabinet override      01/06/18 0847 01/06/18 2059   01/02/18 1030  piperacillin-tazobactam (ZOSYN) IVPB 3.375 g     3.375 g 12.5 mL/hr over 240 Minutes Intravenous Every 8 hours 01/02/18 0918     01/02/18 1000  vancomycin (VANCOCIN) IVPB 1000 mg/200 mL premix  1,000 mg 200 mL/hr over 60 Minutes Intravenous Every 8 hours 01/02/18 0927     01/01/18 1029  tobramycin (NEBCIN) powder  Status:  Discontinued       As needed 01/01/18 1029 01/01/18 1139   01/01/18 1029  vancomycin (VANCOCIN) powder  Status:  Discontinued       As needed 01/01/18 1029 01/01/18 1139   01/01/18 0915  ceFAZolin (ANCEF) 3 g in dextrose 5 % 50 mL IVPB  Status:  Discontinued     3 g 160 mL/hr over 30 Minutes Intravenous To Surgery 01/01/18 0908 01/01/18 1155   12/30/17 0929  tobramycin (NEBCIN) powder  Status:  Discontinued        As needed 12/30/17 0929 12/30/17 1057   12/30/17 0929  vancomycin (VANCOCIN) powder  Status:  Discontinued       As needed 12/30/17 0929 12/30/17 1057   12/30/17 0900  ceFAZolin (ANCEF) IVPB 2 g/50 mL premix     2 g 100 mL/hr over 30 Minutes Intravenous  Once 12/30/17 0851 12/30/17 0858   12/27/17 1056  vancomycin (VANCOCIN) powder  Status:  Discontinued       As needed 12/27/17 1056 12/27/17 1212   12/27/17 1000  ceFAZolin (ANCEF) IVPB 1 g/50 mL premix     1 g 100 mL/hr over 30 Minutes Intravenous  Once 12/27/17 1000 01/01/18 0909   12/26/17 2000  cefTRIAXone (ROCEPHIN) 2 g in sodium chloride 0.9 % 100 mL IVPB  Status:  Discontinued     2 g 200 mL/hr over 30 Minutes Intravenous Every 24 hours 12/26/17 1857 01/02/18 0918   12/26/17 1540  vancomycin (VANCOCIN) powder  Status:  Discontinued       As needed 12/26/17 1542 12/26/17 1656   12/26/17 1539  tobramycin (NEBCIN) powder  Status:  Discontinued       As needed 12/26/17 1539 12/26/17 1656   12/26/17 1200  ceFAZolin (ANCEF) IVPB 2g/100 mL premix  Status:  Discontinued     2 g 200 mL/hr over 30 Minutes Intravenous Every 8 hours 12/26/17 0640 12/26/17 1857   12/26/17 0400  ceFAZolin (ANCEF) IVPB 1 g/50 mL premix  Status:  Discontinued     1 g 100 mL/hr over 30 Minutes Intravenous Every 8 hours 12/25/17 2140 12/26/17 0640   12/25/17 1907  ceFAZolin (ANCEF) 2-4 GM/100ML-% IVPB    Note to Pharmacy:  Lysle Pearl   : cabinet override      12/25/17 1907 12/25/17 2030      Assessment/Plan: s/p Procedure(s): OPEN REDUCTION INTERNAL FIXATION (ORIF) TIBIAL PLATEAU Advance diet OT/PT/Rehab consutation  LOS: 12 days   Marta Lamas. Gae Bon, MD, FACS [phone] Trauma Surgeon 01/06/2018

## 2018-01-06 NOTE — Anesthesia Procedure Notes (Signed)
Procedure Name: Intubation Date/Time: 01/06/2018 9:17 AM Performed by: Moshe Salisbury, CRNA Pre-anesthesia Checklist: Patient identified, Emergency Drugs available, Suction available and Patient being monitored Patient Re-evaluated:Patient Re-evaluated prior to induction Oxygen Delivery Method: Circle System Utilized Preoxygenation: Pre-oxygenation with 100% oxygen Induction Type: IV induction Ventilation: Mask ventilation without difficulty Laryngoscope Size: Mac and 3 Grade View: Grade II Tube type: Oral Tube size: 7.5 mm Number of attempts: 1 Airway Equipment and Method: Stylet Placement Confirmation: ETT inserted through vocal cords under direct vision,  positive ETCO2 and breath sounds checked- equal and bilateral Secured at: 21 cm Tube secured with: Tape Dental Injury: Teeth and Oropharynx as per pre-operative assessment

## 2018-01-07 DIAGNOSIS — G8918 Other acute postprocedural pain: Secondary | ICD-10-CM

## 2018-01-07 DIAGNOSIS — T380X5A Adverse effect of glucocorticoids and synthetic analogues, initial encounter: Secondary | ICD-10-CM

## 2018-01-07 DIAGNOSIS — S42351A Displaced comminuted fracture of shaft of humerus, right arm, initial encounter for closed fracture: Secondary | ICD-10-CM

## 2018-01-07 DIAGNOSIS — T148XXA Other injury of unspecified body region, initial encounter: Secondary | ICD-10-CM

## 2018-01-07 DIAGNOSIS — D62 Acute posthemorrhagic anemia: Secondary | ICD-10-CM

## 2018-01-07 DIAGNOSIS — I6200 Nontraumatic subdural hemorrhage, unspecified: Secondary | ICD-10-CM

## 2018-01-07 DIAGNOSIS — S36113A Laceration of liver, unspecified degree, initial encounter: Secondary | ICD-10-CM

## 2018-01-07 DIAGNOSIS — J90 Pleural effusion, not elsewhere classified: Secondary | ICD-10-CM

## 2018-01-07 DIAGNOSIS — J9601 Acute respiratory failure with hypoxia: Secondary | ICD-10-CM

## 2018-01-07 DIAGNOSIS — R739 Hyperglycemia, unspecified: Secondary | ICD-10-CM

## 2018-01-07 DIAGNOSIS — S069X9A Unspecified intracranial injury with loss of consciousness of unspecified duration, initial encounter: Secondary | ICD-10-CM

## 2018-01-07 DIAGNOSIS — D72829 Elevated white blood cell count, unspecified: Secondary | ICD-10-CM

## 2018-01-07 DIAGNOSIS — S065XAA Traumatic subdural hemorrhage with loss of consciousness status unknown, initial encounter: Secondary | ICD-10-CM

## 2018-01-07 DIAGNOSIS — S62333A Displaced fracture of neck of third metacarpal bone, left hand, initial encounter for closed fracture: Secondary | ICD-10-CM

## 2018-01-07 DIAGNOSIS — S82141B Displaced bicondylar fracture of right tibia, initial encounter for open fracture type I or II: Secondary | ICD-10-CM

## 2018-01-07 DIAGNOSIS — S2249XA Multiple fractures of ribs, unspecified side, initial encounter for closed fracture: Secondary | ICD-10-CM

## 2018-01-07 DIAGNOSIS — S62331A Displaced fracture of neck of second metacarpal bone, left hand, initial encounter for closed fracture: Secondary | ICD-10-CM

## 2018-01-07 DIAGNOSIS — J9690 Respiratory failure, unspecified, unspecified whether with hypoxia or hypercapnia: Secondary | ICD-10-CM

## 2018-01-07 DIAGNOSIS — S065X9A Traumatic subdural hemorrhage with loss of consciousness of unspecified duration, initial encounter: Secondary | ICD-10-CM

## 2018-01-07 LAB — CULTURE, BLOOD (ROUTINE X 2)
CULTURE: NO GROWTH
Culture: NO GROWTH
Special Requests: ADEQUATE
Special Requests: ADEQUATE

## 2018-01-07 LAB — BASIC METABOLIC PANEL
Anion gap: 8 (ref 5–15)
BUN: 14 mg/dL (ref 6–20)
CO2: 25 mmol/L (ref 22–32)
CREATININE: 0.85 mg/dL (ref 0.44–1.00)
Calcium: 8.4 mg/dL — ABNORMAL LOW (ref 8.9–10.3)
Chloride: 106 mmol/L (ref 98–111)
Glucose, Bld: 117 mg/dL — ABNORMAL HIGH (ref 70–99)
POTASSIUM: 4 mmol/L (ref 3.5–5.1)
SODIUM: 139 mmol/L (ref 135–145)

## 2018-01-07 LAB — CBC WITH DIFFERENTIAL/PLATELET
Abs Immature Granulocytes: 0.2 10*3/uL — ABNORMAL HIGH (ref 0.0–0.1)
BASOS ABS: 0 10*3/uL (ref 0.0–0.1)
BASOS PCT: 0 %
Eosinophils Absolute: 0.3 10*3/uL (ref 0.0–0.7)
Eosinophils Relative: 2 %
HCT: 23.7 % — ABNORMAL LOW (ref 36.0–46.0)
HEMOGLOBIN: 7.5 g/dL — AB (ref 12.0–15.0)
IMMATURE GRANULOCYTES: 1 %
LYMPHS PCT: 13 %
Lymphs Abs: 2.3 10*3/uL (ref 0.7–4.0)
MCH: 29.6 pg (ref 26.0–34.0)
MCHC: 31.6 g/dL (ref 30.0–36.0)
MCV: 93.7 fL (ref 78.0–100.0)
MONO ABS: 1.1 10*3/uL — AB (ref 0.1–1.0)
Monocytes Relative: 6 %
NEUTROS ABS: 13.9 10*3/uL — AB (ref 1.7–7.7)
NEUTROS PCT: 78 %
PLATELETS: 304 10*3/uL (ref 150–400)
RBC: 2.53 MIL/uL — AB (ref 3.87–5.11)
RDW: 14.6 % (ref 11.5–15.5)
WBC: 17.9 10*3/uL — AB (ref 4.0–10.5)

## 2018-01-07 LAB — GLUCOSE, CAPILLARY
GLUCOSE-CAPILLARY: 105 mg/dL — AB (ref 70–99)
GLUCOSE-CAPILLARY: 117 mg/dL — AB (ref 70–99)
GLUCOSE-CAPILLARY: 119 mg/dL — AB (ref 70–99)
GLUCOSE-CAPILLARY: 135 mg/dL — AB (ref 70–99)
Glucose-Capillary: 116 mg/dL — ABNORMAL HIGH (ref 70–99)
Glucose-Capillary: 104 mg/dL — ABNORMAL HIGH (ref 70–99)

## 2018-01-07 MED ORDER — ALUM & MAG HYDROXIDE-SIMETH 200-200-20 MG/5ML PO SUSP
30.0000 mL | ORAL | Status: DC | PRN
  Administered 2018-01-07: 30 mL via ORAL
  Filled 2018-01-07: qty 30

## 2018-01-07 MED ORDER — FUROSEMIDE 10 MG/ML IJ SOLN
40.0000 mg | Freq: Once | INTRAMUSCULAR | Status: AC
  Administered 2018-01-07: 40 mg via INTRAVENOUS
  Filled 2018-01-07: qty 4

## 2018-01-07 MED ORDER — MAGNESIUM CITRATE PO SOLN
1.0000 | Freq: Once | ORAL | Status: DC
  Filled 2018-01-07: qty 296

## 2018-01-07 MED ORDER — VANCOMYCIN HCL IN DEXTROSE 1-5 GM/200ML-% IV SOLN
1000.0000 mg | Freq: Three times a day (TID) | INTRAVENOUS | Status: DC
  Filled 2018-01-07: qty 200

## 2018-01-07 MED ORDER — PRO-STAT SUGAR FREE PO LIQD
30.0000 mL | Freq: Two times a day (BID) | ORAL | Status: DC
  Administered 2018-01-07 – 2018-01-09 (×4): 30 mL via ORAL
  Filled 2018-01-07 (×5): qty 30
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
## 2018-01-07 NOTE — Progress Notes (Signed)
Met with pt and mother with sign language interpreter present.  We discussed discharge planning, namely PT/OT recommendation of SNF vs CIR needed.  Pt is open to inpatient rehab, but is uncertain if she will rehab here at [facility], if accepted, or in [place].  She is very focused on returning to work, and seems worried about others that are having to cover for her right now.  She wants to explore the option of rehab in [place], as it will be closer to her work.  Admissions coordinator to meet with pt and family later today, per mother.    I did explain that disposition would be somewhat dictated by [facility]; will reach out to Zachery Dakins, RN, CRP, Medical Case Manager for pt to update on pt's condition and discuss dc plan.  Phone:  [phone].  Faxed updated clinical information to Case Manager at [phone].  Will follow with updates as available.    Reinaldo Raddle, RN, BSN  Trauma/Neuro ICU Case Manager [phone]
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
## 2018-01-07 NOTE — Progress Notes (Signed)
Orthopedic Tech Progress Note Patient Details:  Dorothy Wilson 02-Apr-1971 [id number]  Patient ID: Dorothy Wilson, female   DOB: 02-Apr-1971, 47 y.o.   MRN: [id number]   [name], [name] 01/07/2018, 9:50 AM Called in bio-tech brace order;spoke with Wylene MenLacey

## 2018-01-08 ENCOUNTER — Other Ambulatory Visit: Payer: Self-pay

## 2018-01-08 ENCOUNTER — Encounter (HOSPITAL_COMMUNITY): Payer: Self-pay | Admitting: Student

## 2018-01-08 LAB — BASIC METABOLIC PANEL
ANION GAP: 7 (ref 5–15)
BUN: 14 mg/dL (ref 6–20)
CALCIUM: 8.4 mg/dL — AB (ref 8.9–10.3)
CO2: 25 mmol/L (ref 22–32)
CREATININE: 0.86 mg/dL (ref 0.44–1.00)
Chloride: 107 mmol/L (ref 98–111)
GFR calc Af Amer: >60 mL/min (ref >60)
Glucose, Bld: 123 mg/dL — ABNORMAL HIGH (ref 70–99)
Potassium: 4 mmol/L (ref 3.5–5.1)
Sodium: 139 mmol/L (ref 135–145)

## 2018-01-08 LAB — GLUCOSE, CAPILLARY
GLUCOSE-CAPILLARY: 149 mg/dL — AB (ref 70–99)
GLUCOSE-CAPILLARY: 130 mg/dL — AB (ref 70–99)
Glucose-Capillary: 120 mg/dL — ABNORMAL HIGH (ref 70–99)
Glucose-Capillary: 113 mg/dL — ABNORMAL HIGH (ref 70–99)

## 2018-01-08 LAB — CBC
HCT: 24.8 % — ABNORMAL LOW (ref 36.0–46.0)
Hemoglobin: 8.1 g/dL — ABNORMAL LOW (ref 12.0–15.0)
MCH: 30.2 pg (ref 26.0–34.0)
MCHC: 32.7 g/dL (ref 30.0–36.0)
MCV: 92.5 fL (ref 78.0–100.0)
PLATELETS: 350 10*3/uL (ref 150–400)
RBC: 2.68 MIL/uL — ABNORMAL LOW (ref 3.87–5.11)
RDW: 15.1 % (ref 11.5–15.5)
WBC: 18.6 10*3/uL — AB (ref 4.0–10.5)

## 2018-01-08 MED ORDER — IPRATROPIUM-ALBUTEROL 0.5-2.5 (3) MG/3ML IN SOLN: 3.0000 mL | Freq: Four times a day (QID) | RESPIRATORY_TRACT | Status: DC | PRN

## 2018-01-08 MED ORDER — LORAZEPAM 1 MG PO TABS
1.0000 mg | ORAL_TABLET | Freq: Four times a day (QID) | ORAL | Status: DC | PRN
  Administered 2018-01-08: 1 mg via ORAL
  Filled 2018-01-08: qty 1
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
## 2018-01-08 NOTE — Progress Notes (Signed)
Spoke with Jodene Namamera RN regarding patient's PICC line. Nurse reported that per physical therapist "purple line is out." Upon speaking with Jodene Namamera RN, she stated and documented that she finished removing the PICC line per documentation at 1335 on 8/28. At this time no intravenous infusions are ordered. Instructed to notify physician. Instructed to place another consult for [facility] if vascular access is needed. VU. Tomasita MorrowHeather Miia Blanks, RN [facility]
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
## 2018-01-08 NOTE — Progress Notes (Signed)
[facility] information given to Dorothy FanningJulie, CSW. Workmans Environmental education officercompensation management is to come by ~1330 tomorrow 01/09/2018, arrangements made for that interaction.

## 2018-01-08 NOTE — Progress Notes (Addendum)
Spoke with pt's WC Case Manager, Verneda SkillKathryn Alexander, this morning, and again this afternoon to discuss discharge planning.  I have made her aware of patient's desire to rehab in the [place] area closer to [place] home.  Ms. Lyn Hollingsheadlexander [facility] at [facility]:30 to meet with pt and her family to discuss available options.  I have arranged for sign language interpreter to come to pt's hospital room at 1:30 to assist with the meeting.    Case Manager made aware that pt is medically stable for dc to inpatient rehab; she states that search in progress for inpatient rehab facility that will accept WC.  She states that pt could be discharged tomorrow, if facility found, bed available, AND transport arranged.  I did offer to check with [facility] regarding transport, but CM aware that I will need accepting facility, accepting MD, and room # prior to arranging.  WC Case Manager plans to call pt's mother to provide updates later today.  Quintella BatonJulie W. Antoinne Spadaccini, RN, BSN  Trauma/Neuro ICU Case Manager [phone]   Addendum: [phone] Received phone call from Theresia MajorsVivian Brown, supervisor at [employer], [facility] (phone: [phone]):  She states that she has spoken with [facility] [place], and they are interested in patient.  Will fax referral to rehab hospital, per [facility] of [facility] to [phone], phone: [phone].    Quintella BatonJulie W. Sebastian Dzik, RN, BSN  Trauma/Neuro ICU Case Manager [phone]

## 2018-01-08 NOTE — Progress Notes (Signed)
Hearing impaired interpreter is on the unit and wants to know if Nurse is ready to complete a.m assessment. Nurse agrees. Patient denies pain but states a desire to have more oral care completed. That was done. Upon assessment Nurse finds that patient is soaking wet, gown, and linen.  Nurse Tech helps to bath, CHG, and transfer patient to the chair where she sat for breakfast.   0915: Reassessment because patient chair alarm went off. Patient is fine in chair just needs help to open milk for breakfast, patient does not want anything from breakfast tray. Instead Nurse took her fruit cups, orange juice, and applesauce.   0930: Patient mom is on unit visiting in patient's room.
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
## 2018-01-09 LAB — CBC
HCT: 26 % — ABNORMAL LOW (ref 36.0–46.0)
HEMOGLOBIN: 8.3 g/dL — AB (ref 12.0–15.0)
MCH: 29.6 pg (ref 26.0–34.0)
MCHC: 31.9 g/dL (ref 30.0–36.0)
MCV: 92.9 fL (ref 78.0–100.0)
PLATELETS: 388 10*3/uL (ref 150–400)
RBC: 2.8 MIL/uL — AB (ref 3.87–5.11)
RDW: 16.1 % — ABNORMAL HIGH (ref 11.5–15.5)
WBC: 16.4 10*3/uL — AB (ref 4.0–10.5)

## 2018-01-09 LAB — GLUCOSE, CAPILLARY
GLUCOSE-CAPILLARY: 111 mg/dL — AB (ref 70–99)
GLUCOSE-CAPILLARY: 110 mg/dL — AB (ref 70–99)
GLUCOSE-CAPILLARY: 113 mg/dL — AB (ref 70–99)
GLUCOSE-CAPILLARY: 107 mg/dL — AB (ref 70–99)
Glucose-Capillary: 102 mg/dL — ABNORMAL HIGH (ref 70–99)

## 2018-01-09 MED ORDER — LABETALOL HCL 5 MG/ML IV SOLN: 10.0000 mg | INTRAVENOUS | Status: AC | PRN

## 2018-01-09 MED ORDER — INSULIN ASPART 100 UNIT/ML ~~LOC~~ SOLN: 0.0000 [IU] | SUBCUTANEOUS | 11 refills | Status: AC

## 2018-01-09 MED ORDER — BACITRACIN ZINC 500 UNIT/GM EX OINT: TOPICAL_OINTMENT | Freq: Two times a day (BID) | CUTANEOUS | 0 refills | Status: AC

## 2018-01-09 MED ORDER — ALUM & MAG HYDROXIDE-SIMETH 200-200-20 MG/5ML PO SUSP: 30.0000 mL | ORAL | 0 refills | Status: AC | PRN

## 2018-01-09 MED ORDER — SODIUM CHLORIDE 0.9% FLUSH: 10.0000 mL | Freq: Two times a day (BID) | INTRAVENOUS | Status: AC

## 2018-01-09 MED ORDER — WHITE PETROLATUM EX OINT: 1.0000 "application " | TOPICAL_OINTMENT | CUTANEOUS | 0 refills | Status: AC | PRN

## 2018-01-09 MED ORDER — IPRATROPIUM-ALBUTEROL 0.5-2.5 (3) MG/3ML IN SOLN: 3.0000 mL | Freq: Four times a day (QID) | RESPIRATORY_TRACT | Status: AC | PRN

## 2018-01-09 MED ORDER — FENTANYL CITRATE (PF) 100 MCG/2ML IJ SOLN: 50.0000 ug | INTRAMUSCULAR | 0 refills | Status: AC | PRN

## 2018-01-09 MED ORDER — OXYCODONE HCL 5 MG PO TABS: 5.0000 mg | ORAL_TABLET | ORAL | 0 refills | Status: AC | PRN

## 2018-01-09 MED ORDER — ENOXAPARIN SODIUM 40 MG/0.4ML ~~LOC~~ SOLN: 40.0000 mg | SUBCUTANEOUS | 0 refills | Status: AC

## 2018-01-09 MED ORDER — ORAL CARE MOUTH RINSE: 15.0000 mL | Freq: Two times a day (BID) | OROMUCOSAL | 0 refills | Status: AC

## 2018-01-09 MED ORDER — LORAZEPAM 1 MG PO TABS: 1.0000 mg | ORAL_TABLET | Freq: Four times a day (QID) | ORAL | 0 refills | Status: AC | PRN

## 2018-01-09 MED ORDER — CHLORHEXIDINE GLUCONATE CLOTH 2 % EX PADS: 6.0000 | MEDICATED_PAD | Freq: Every day | CUTANEOUS | Status: AC

## 2018-01-09 MED ORDER — PRO-STAT SUGAR FREE PO LIQD: 30.0000 mL | Freq: Two times a day (BID) | ORAL | 0 refills | Status: AC

## 2018-01-09 MED ORDER — MAGNESIUM CITRATE PO SOLN: 1.0000 | Freq: Once | ORAL | Status: AC

## 2018-01-09 MED ORDER — PANTOPRAZOLE SODIUM 40 MG PO TBEC: 40.0000 mg | DELAYED_RELEASE_TABLET | Freq: Every day | ORAL | Status: AC

## 2018-01-09 MED ORDER — METOPROLOL TARTRATE 25 MG PO TABS: 25.0000 mg | ORAL_TABLET | Freq: Two times a day (BID) | ORAL | Status: AC

## 2018-01-09 MED ORDER — SODIUM CHLORIDE 0.9% FLUSH: 10.0000 mL | INTRAVENOUS | Status: AC | PRN

## 2018-01-09 MED ORDER — POLYETHYLENE GLYCOL 3350 17 G PO PACK: 17.0000 g | PACK | Freq: Every day | ORAL | 0 refills | Status: AC | PRN

## 2018-01-09 MED ORDER — SODIUM CHLORIDE 0.9 % IV SOLN: 10.0000 mL | INTRAVENOUS | 0 refills | Status: AC | PRN

## 2018-01-09 MED ORDER — DOCUSATE SODIUM 100 MG PO CAPS: 100.0000 mg | ORAL_CAPSULE | Freq: Two times a day (BID) | ORAL | 0 refills | Status: AC

## 2018-01-09 MED ORDER — ACETAMINOPHEN 160 MG/5ML PO SOLN: 650.0000 mg | Freq: Four times a day (QID) | ORAL | 0 refills | Status: AC | PRN

## 2018-01-09 MED ORDER — RACEPINEPHRINE HCL 2.25 % IN NEBU: 0.5000 mL | INHALATION_SOLUTION | RESPIRATORY_TRACT | Status: AC | PRN

## 2018-01-09 NOTE — Progress Notes (Signed)
Sutures x 10 removed from right upper arm.  Area clean and dry with no signs or symptoms of infection.  Sutures x 5 removed from left hand.  Area clean and dry with no signs or symptoms of infection.
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
## 2018-01-09 NOTE — Social Work (Signed)
[facility] ED CSW met with pt at pt's bedside. Pt's father in pt's room with her. Pt agreed to meet with CSW without interpreter. Pt able to understand CSW through reading lips and communicated to CSW through speaking. CSW started to complete SBIRT with pt. Pt reported that she rarely drinks and if she does it is only white wine. Pt stated she does not drink liquor or beer. Pt stated her last glass of wine was over 6 months ago. Pt reported that she never has more than one glass at a time. SBIRT completed.  Pt stated she is in good moods and is excited to get into rehab and back to her life. Pt stated that she has a great support system. Pt's father is in the room and he is up from [place] visiting. Pt's boyfriend and best friend are coming up from [place] next week to visit her at rehab. Pt stated she is from [place] and excited to get back home.   Per case manager's note, pt is being transported to [facility] tomorrow morning at 8 am by [facility] Time.  Wendelyn Breslow, [facility]  [phone]
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
## 2018-01-09 NOTE — Progress Notes (Signed)
Orthopedic Tech Progress Note Patient Details:  Valentino HueMarilyn Lezlie Freas 1970/11/01 [id number]  Ortho Devices Type of Ortho Device: Coapt, Knee Immobilizer Ortho Device/Splint Location: Right Prafo boot Ortho Device/Splint Interventions: Application   Post Interventions Patient Tolerated: Well Instructions Provided: Care of device   Saul FordyceJennifer C Roemello Speyer 01/09/2018, 6:50 PM

## 2018-01-09 NOTE — Progress Notes (Signed)
[facility]/Trauma Progress Note  3 Days Post-Op   Assessment/Plan PHBC Acute hypoxic ventilator dependent respiratory failure- extubated Mult R rib FX with HPTX/pulm contusion-thoracentesis on 8/25. Moving good air. CXR 08/25 no PTX seen, on RA Grade 4 liver lac-hgb stable. 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 Franky Macho; d/c c spine precautions 8/25 ABL anemia- hgb 8.1 today. am labs Thrombocytopenia- resolved Open R tib fib- NWB - S/P Ex fix Dr. Jena Gauss 8/15 - S/P I&D & ABX beads 8/19  Dr. Carola Frost - S/P partial ORIF tibia 8/21 Dr. Jena Gauss - S/p ORIF of right tibia with wound VAC placement 8/26 Dr. Jena Gauss - wound vac til 08/29 RLE foot drop - AFO per ortho R humerus FX- S/P ORIF by Dr. Carola Frost, WBAT R second distal phalanx FX- per Dr. Jena Gauss L 2-4 MC FXs- S/P ORIF/CR by Dr. Jena Gauss 8/16, WBAT thru elbow L L2-3 TVP FXs Road Rash - bacitracin and zeroform  FEN-regular diet VTE- Lovenox ID- afebrile,Cultures neg. WBC 18.6,vanc/zosyn stopped 08/27 Foley: none Follow up: TBD  Dispo- working on rehab closer to home in [place]. WC meeting today at 1300. CBC pending    LOS: 15 days    Subjective: CC: R leg pain  Pain well controlled. No more nausea. She is ready to leave. Mild numbness of R foot, R hand and tongue. She is having a difficult time moving R foot due to swelling and ACE. No family at bedside. Interpreter at bedside.   Objective: Vital signs in last 24 hours: Temp:  [98.5 F (36.9 C)-99.9 F (37.7 C)] 98.9 F (37.2 C) (08/29 0221) Pulse Rate:  [88-100] 98 (08/29 0400) Resp:  [19-34] 25 (08/29 0400) BP: (132-156)/(67-80) 151/79 (08/29 0400) SpO2:  [96 %-99 %] 99 % (08/29 0400) Weight:  [120 kg-123 kg] 123 kg (08/29 0630) Last BM Date: 01/08/18  Intake/Output from previous day: 08/28 0701 - 08/29 0700 In: -  Out: 2550 [Urine:2550] Intake/Output this shift: No intake/output data recorded.  PE: Gen:   Alert, NAD, pleasant, cooperative Heart: RRR, no murmur noted Lungs: CTAB Abd: soft, +BS, obese, no TTP Ext: R upper arm and L hand incisions are well appearing with sutures intact. Abrasion of L knee is dry with some black eschar, abrasion of L elbow are dry with dressing in place,RLE with ACE and knee immobilizer in place. Wiggles toes well of L foot, difficultly wiggling toes of R foot but does have weak plantar flexion Neuro: A&O, mild decreased sensation of R foot Skin: warm and dry  Anti-infectives: Anti-infectives (From admission, onward)   Start     Dose/Rate Route Frequency Ordered Stop   01/07/18 1200  vancomycin (VANCOCIN) IVPB 1000 mg/200 mL premix  Status:  Discontinued     1,000 mg 200 mL/hr over 60 Minutes Intravenous Every 8 hours 01/07/18 0730 01/07/18 0858   01/06/18 1204  tobramycin (NEBCIN) powder  Status:  Discontinued       As needed 01/06/18 1204 01/06/18 1244   01/06/18 1203  vancomycin (VANCOCIN) powder  Status:  Discontinued       As needed 01/06/18 1203 01/06/18 1244   01/06/18 0900  ceFAZolin (ANCEF) 3 g in dextrose 5 % 50 mL IVPB     3 g 100 mL/hr over 30 Minutes Intravenous  Once 01/06/18 0845 01/06/18 1537   01/06/18 0847  ceFAZolin (ANCEF) 2-4 GM/100ML-% IVPB    Note to Pharmacy:  Dia Crawford   : cabinet override      01/06/18 0847 01/06/18  1308   01/02/18 1030  piperacillin-tazobactam (ZOSYN) IVPB 3.375 g  Status:  Discontinued     3.375 g 12.5 mL/hr over 240 Minutes Intravenous Every 8 hours 01/02/18 0918 01/07/18 0858   01/02/18 1000  vancomycin (VANCOCIN) IVPB 1000 mg/200 mL premix  Status:  Discontinued     1,000 mg 200 mL/hr over 60 Minutes Intravenous Every 8 hours 01/02/18 0927 01/07/18 0730   01/01/18 1029  tobramycin (NEBCIN) powder  Status:  Discontinued       As needed 01/01/18 1029 01/01/18 1139   01/01/18 1029  vancomycin (VANCOCIN) powder  Status:  Discontinued       As needed 01/01/18 1029 01/01/18 1139   01/01/18 0915   ceFAZolin (ANCEF) 3 g in dextrose 5 % 50 mL IVPB  Status:  Discontinued     3 g 160 mL/hr over 30 Minutes Intravenous To Surgery 01/01/18 0908 01/01/18 1155   12/30/17 0929  tobramycin (NEBCIN) powder  Status:  Discontinued       As needed 12/30/17 0929 12/30/17 1057   12/30/17 0929  vancomycin (VANCOCIN) powder  Status:  Discontinued       As needed 12/30/17 0929 12/30/17 1057   12/30/17 0900  ceFAZolin (ANCEF) IVPB 2 g/50 mL premix     2 g 100 mL/hr over 30 Minutes Intravenous  Once 12/30/17 0851 12/30/17 0858   12/27/17 1056  vancomycin (VANCOCIN) powder  Status:  Discontinued       As needed 12/27/17 1056 12/27/17 1212   12/27/17 1000  ceFAZolin (ANCEF) IVPB 1 g/50 mL premix     1 g 100 mL/hr over 30 Minutes Intravenous  Once 12/27/17 1000 01/01/18 0909   12/26/17 2000  cefTRIAXone (ROCEPHIN) 2 g in sodium chloride 0.9 % 100 mL IVPB  Status:  Discontinued     2 g 200 mL/hr over 30 Minutes Intravenous Every 24 hours 12/26/17 1857 01/02/18 0918   12/26/17 1540  vancomycin (VANCOCIN) powder  Status:  Discontinued       As needed 12/26/17 1542 12/26/17 1656   12/26/17 1539  tobramycin (NEBCIN) powder  Status:  Discontinued       As needed 12/26/17 1539 12/26/17 1656   12/26/17 1200  ceFAZolin (ANCEF) IVPB 2g/100 mL premix  Status:  Discontinued     2 g 200 mL/hr over 30 Minutes Intravenous Every 8 hours 12/26/17 0640 12/26/17 1857   12/26/17 0400  ceFAZolin (ANCEF) IVPB 1 g/50 mL premix  Status:  Discontinued     1 g 100 mL/hr over 30 Minutes Intravenous Every 8 hours 12/25/17 2140 12/26/17 0640   12/25/17 1907  ceFAZolin (ANCEF) 2-4 GM/100ML-% IVPB    Note to Pharmacy:  Lysle Pearl   : cabinet override      12/25/17 1907 12/25/17 2030      Lab Results:  Recent Labs    01/07/18 0500 01/08/18 0520  WBC 17.9* 18.6*  HGB 7.5* 8.1*  HCT 23.7* 24.8*  PLT 304 350   BMET Recent Labs    01/07/18 0500 01/08/18 0520  NA 139 139  K 4.0 4.0  CL 106 107  CO2 25 25   GLUCOSE 117* 123*  BUN 14 14  CREATININE 0.85 0.86  CALCIUM 8.4* 8.4*   PT/INR No results for input(s): LABPROT, INR in the last 72 hours. CMP     Component Value Date/Time   NA 139 01/08/2018 0520   K 4.0 01/08/2018 0520   CL 107 01/08/2018 0520   CO2 25  01/08/2018 0520   GLUCOSE 123 (H) 01/08/2018 0520   BUN 14 01/08/2018 0520   CREATININE 0.86 01/08/2018 0520   CALCIUM 8.4 (L) 01/08/2018 0520   PROT 5.2 (L) 12/26/2017 0445   ALBUMIN 2.8 (L) 12/26/2017 0445   AST 1,194 (H) 12/26/2017 0445   ALT 738 (H) 12/26/2017 0445   ALKPHOS 61 12/26/2017 0445   BILITOT 0.9 12/26/2017 0445   GFRNONAA >60 01/08/2018 0520   GFRAA >60 01/08/2018 0520   Lipase  No results found for: LIPASE  Studies/Results: No results found.    Jerre SimonJessica L Romond Pipkins , [facility] 01/09/2018, 8:29 AM  Pager: [phone] Mon-Wed, Friday 7:00am-4:30pm Thurs 7am-11:30am  Consults: [phone]
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
## 2018-01-09 NOTE — Progress Notes (Signed)
WC Case Manager, Verneda SkillKathryn Wilson here to see patient, as scheduled, with sign language interpreter present.  She informs pt and mother that pt has been [facility] [place], and will be transported there in AM at 8:00am.  Pt and mother very pleased with this information.    Transporting agency is [facility]; will prepare discharge packet this evening in anticipation of discharge in AM.  Will have all radiology films put on a disc prior to discharge.  Notified attending MD; will prepare discharge summary.    Dorothy BatonJulie W. Teosha Casso, RN, BSN  Trauma/Neuro ICU Case Manager [phone]

## 2018-01-10 LAB — GLUCOSE, CAPILLARY
GLUCOSE-CAPILLARY: 109 mg/dL — AB (ref 70–99)
GLUCOSE-CAPILLARY: 109 mg/dL — AB (ref 70–99)
Glucose-Capillary: 102 mg/dL — ABNORMAL HIGH (ref 70–99)
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
## 2018-01-10 NOTE — Progress Notes (Signed)
Inpatient Rehabilitation  Note that patient is discharging to rehab close to home.  Will sign off at this time.  Call if questions.   Charlane FerrettiMelissa Michell Giuliano, M.A., CCC/SLP Admission Coordinator  [facility]  Cell [phone]

## 2018-01-10 NOTE — Progress Notes (Signed)
Patient to D/C this AM. From orthopaedic perspective recommend every other day dressing changes to right leg with adaptic, 4x4s, kerlix and ACE wrap. Would recommend follow up with orthopaedic trauma surgeon within 7-10 days for repeat x-rays and possible suture removal for RLE. Continue with knee immobilizer when out of bed and PRAFO while in bed.  Roby LoftsKevin P. Stephine Langbehn, MD [facility] [phone] (phone)
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
## 2019-05-23 IMAGING — RF DG C-ARM 61-120 MIN
1 series · 6 of 6 positions shown · non-contrast
Comparison: CT 12/30/2017

CLINICAL DATA: Proximal tibia ORIF

EXAM:
RIGHT KNEE - COMPLETE 4+ VIEW; DG C-ARM 61-120 MIN

[Series 1: run · 6 of 6 slices shown]
[im 1/6]
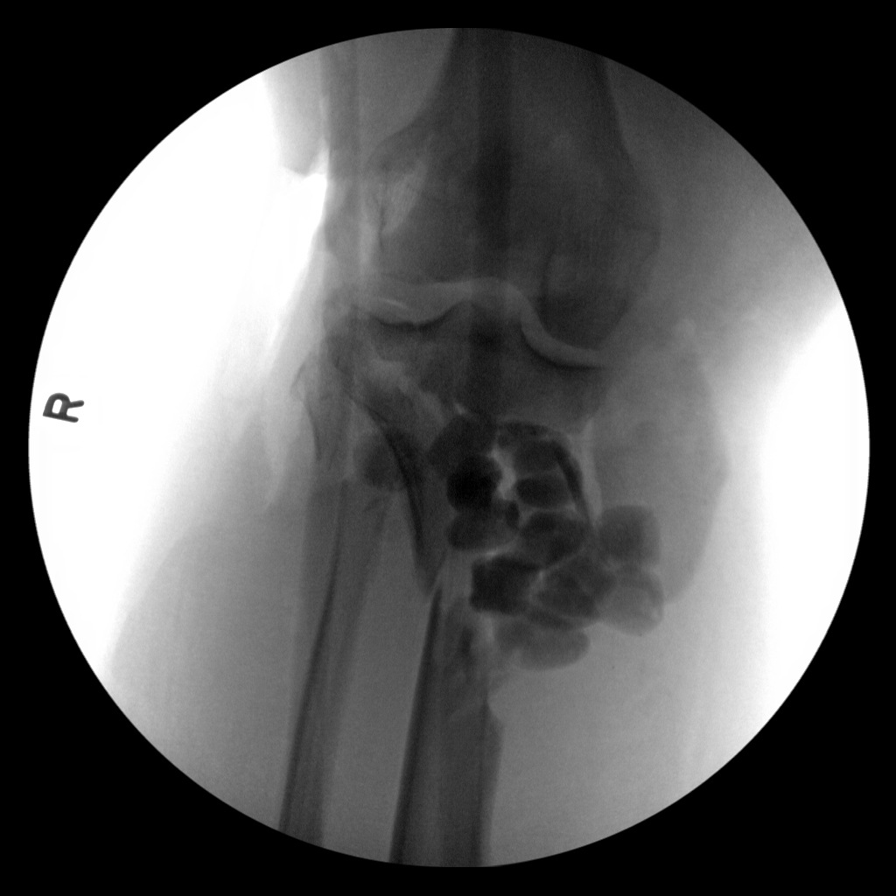
[im 2/6]
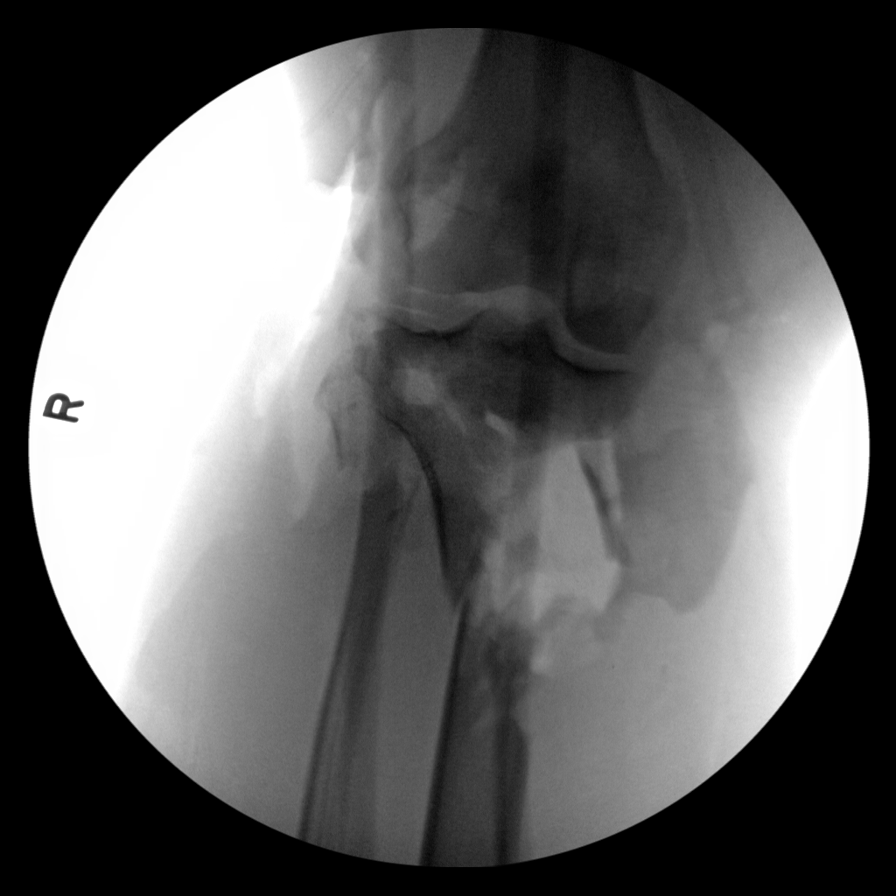
[im 3/6]
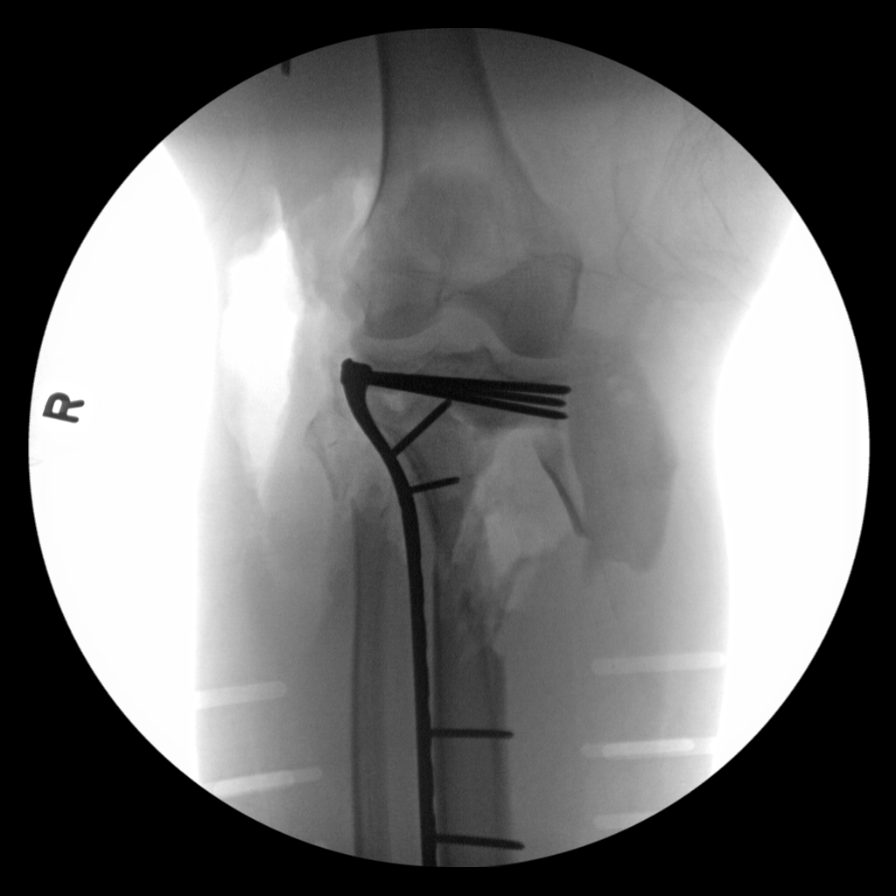
[im 4/6]
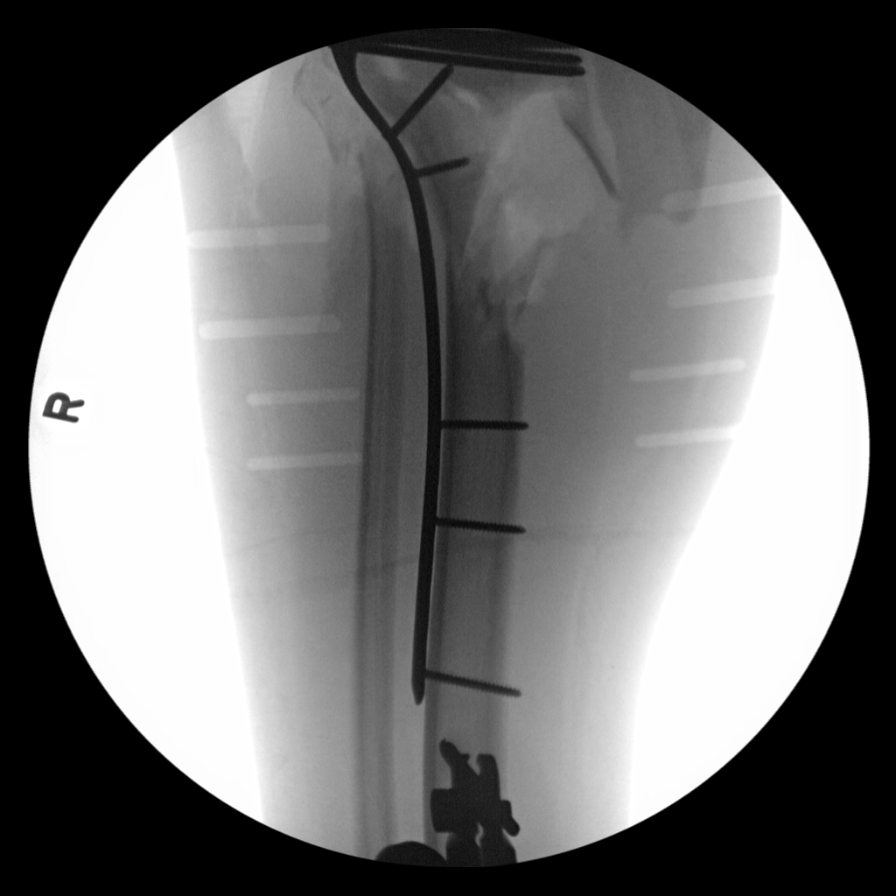
[im 5/6]
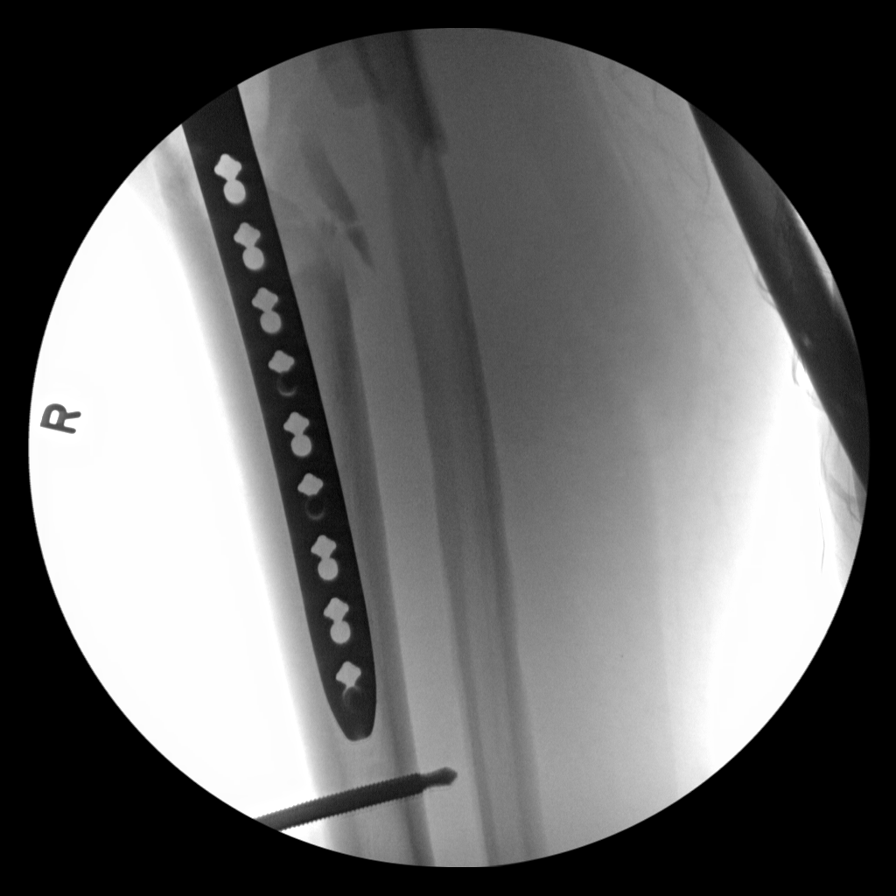
[im 6/6]
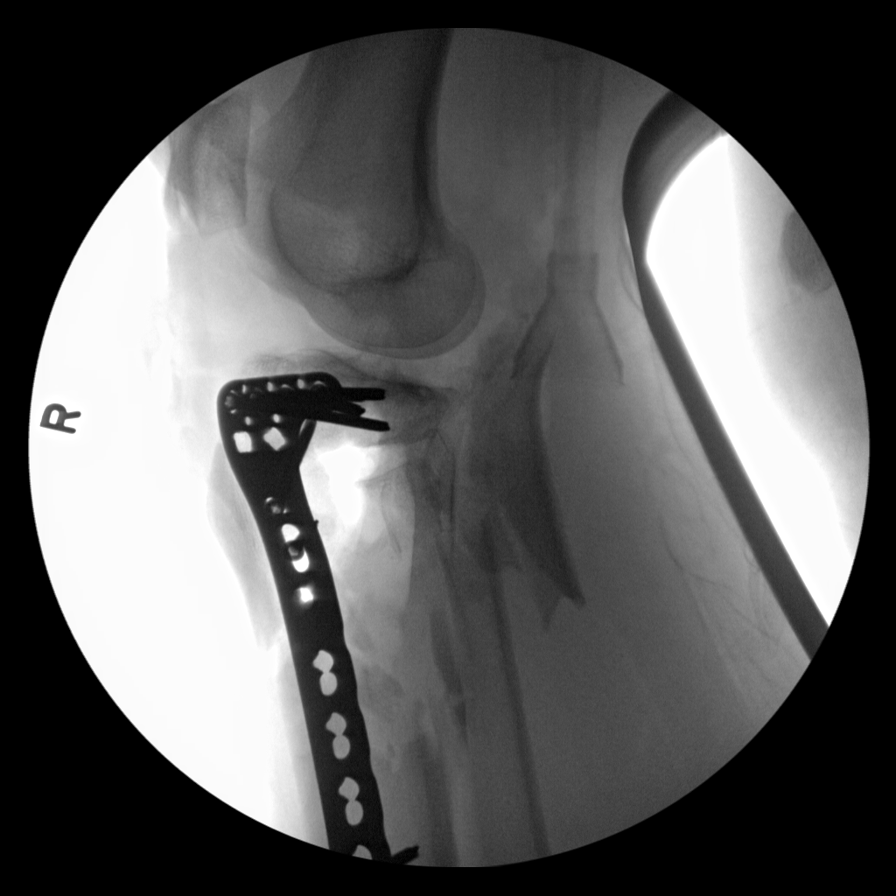

[6 of 6 positions shown; findings below may reference images not displayed]

FINDINGS: Intraprocedural fluoroscopic images show removal of antibiotic beads
with placement of buttress plate. External fixation hardware is in
place.

Fibular neck fracture, known.

No unexpected finding.
IMPRESSION: Fluoroscopy for antibiotic cement removal and tibial fixation.

## 2019-05-23 IMAGING — DX DG KNEE 1-2V PORT*R*
2 series · 2 of 2 positions shown · non-contrast
Comparison: CT right tibia 12/30/2017. Intraoperative imaging
today.

CLINICAL DATA: Status post fixation of a right tibial fracture
today.

EXAM:
PORTABLE RIGHT KNEE - 1-2 VIEW

[knee lat]
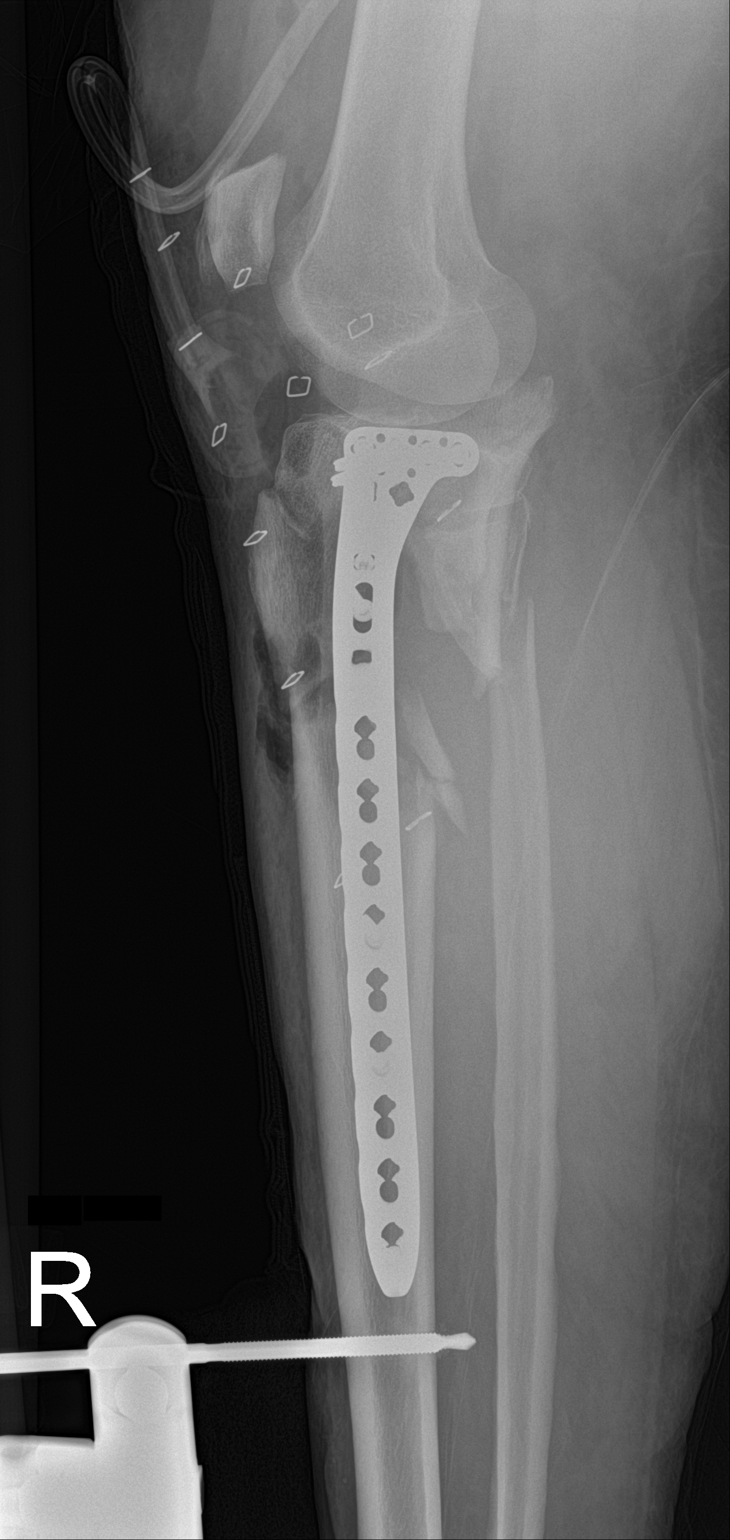

[knee ap]
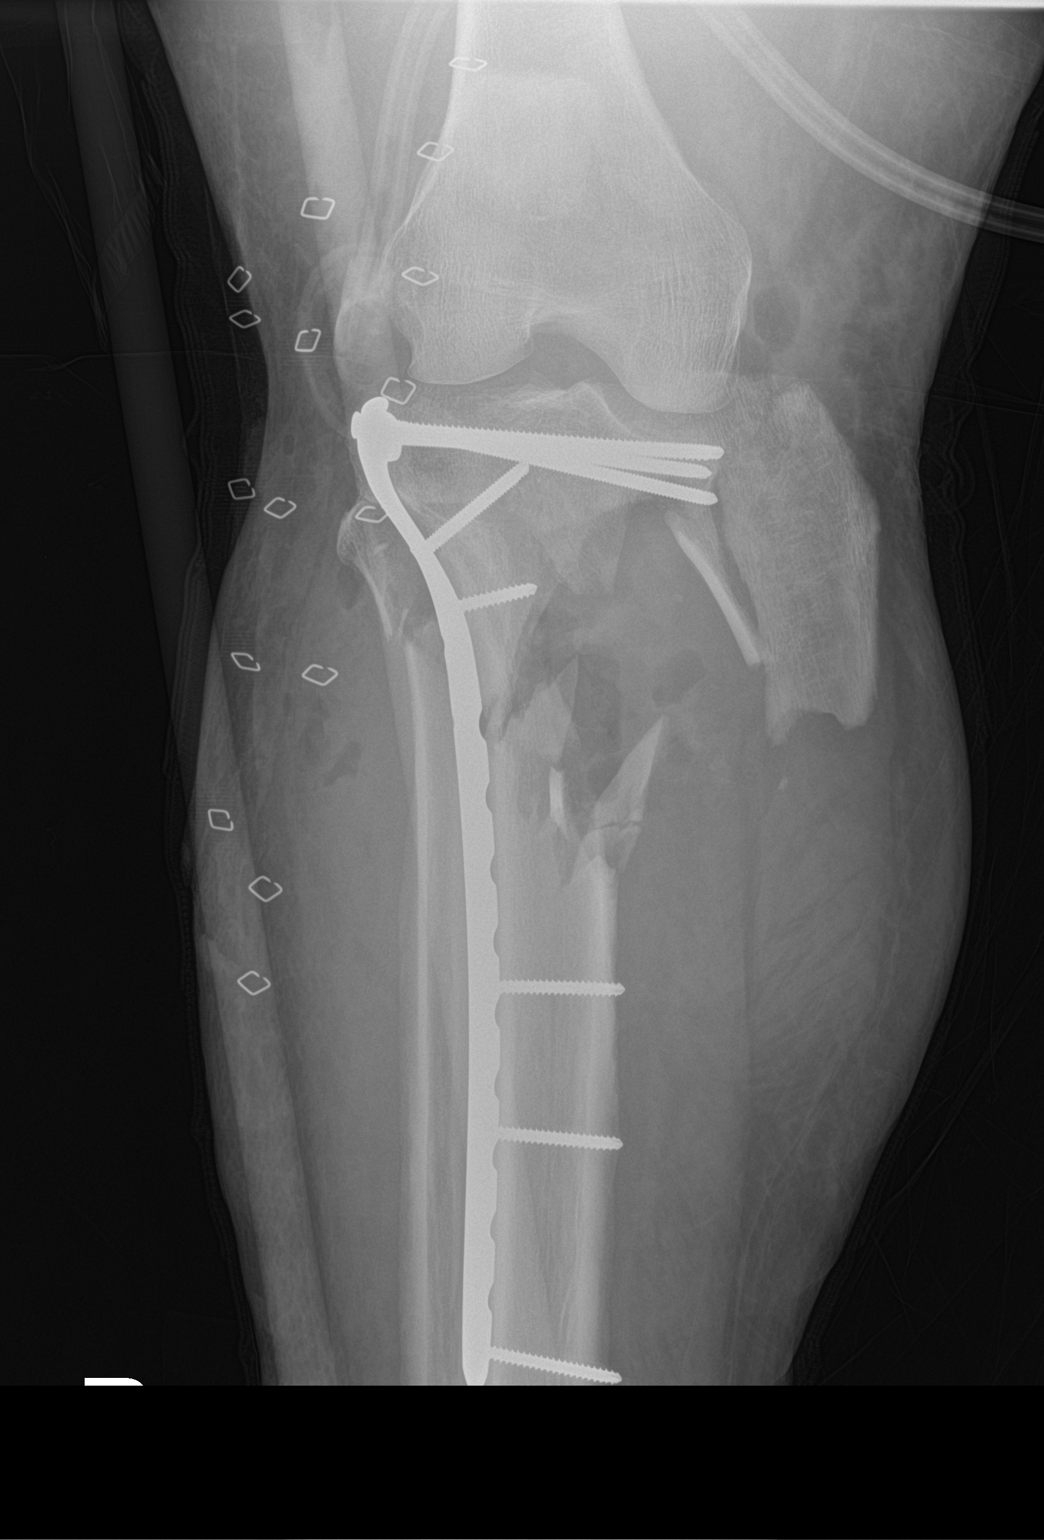

[2 of 2 positions shown; findings below may reference images not displayed]

FINDINGS: The patient has new lateral plate and screws for fixation of a
comminuted proximal tibial fracture. A large bone fragment from the
proximal metaphysis and diaphysis measuring 7.7 cm craniocaudal by
approximately 3.3 cm transverse is medially displaced 4.7 cm into
the soft tissues. Displacement of this fragment leads a large gap in
the posterior and medial aspects of the proximal tibia. Vac drain
and surgical staples are noted. Proximal fibular fracture is noted.
IMPRESSION: Status post ORIF of a proximal tibial fracture as described above.
Large bone fragment medially displaced into the soft tissues is
noted as seen on the prior exams.

## 2019-05-24 IMAGING — DX DG CHEST 1V PORT
1 series · 1 of 1 positions shown · non-contrast
Comparison: 01/01/2018

CLINICAL DATA: Respiratory failure.

EXAM:
PORTABLE CHEST 1 VIEW

[chest]
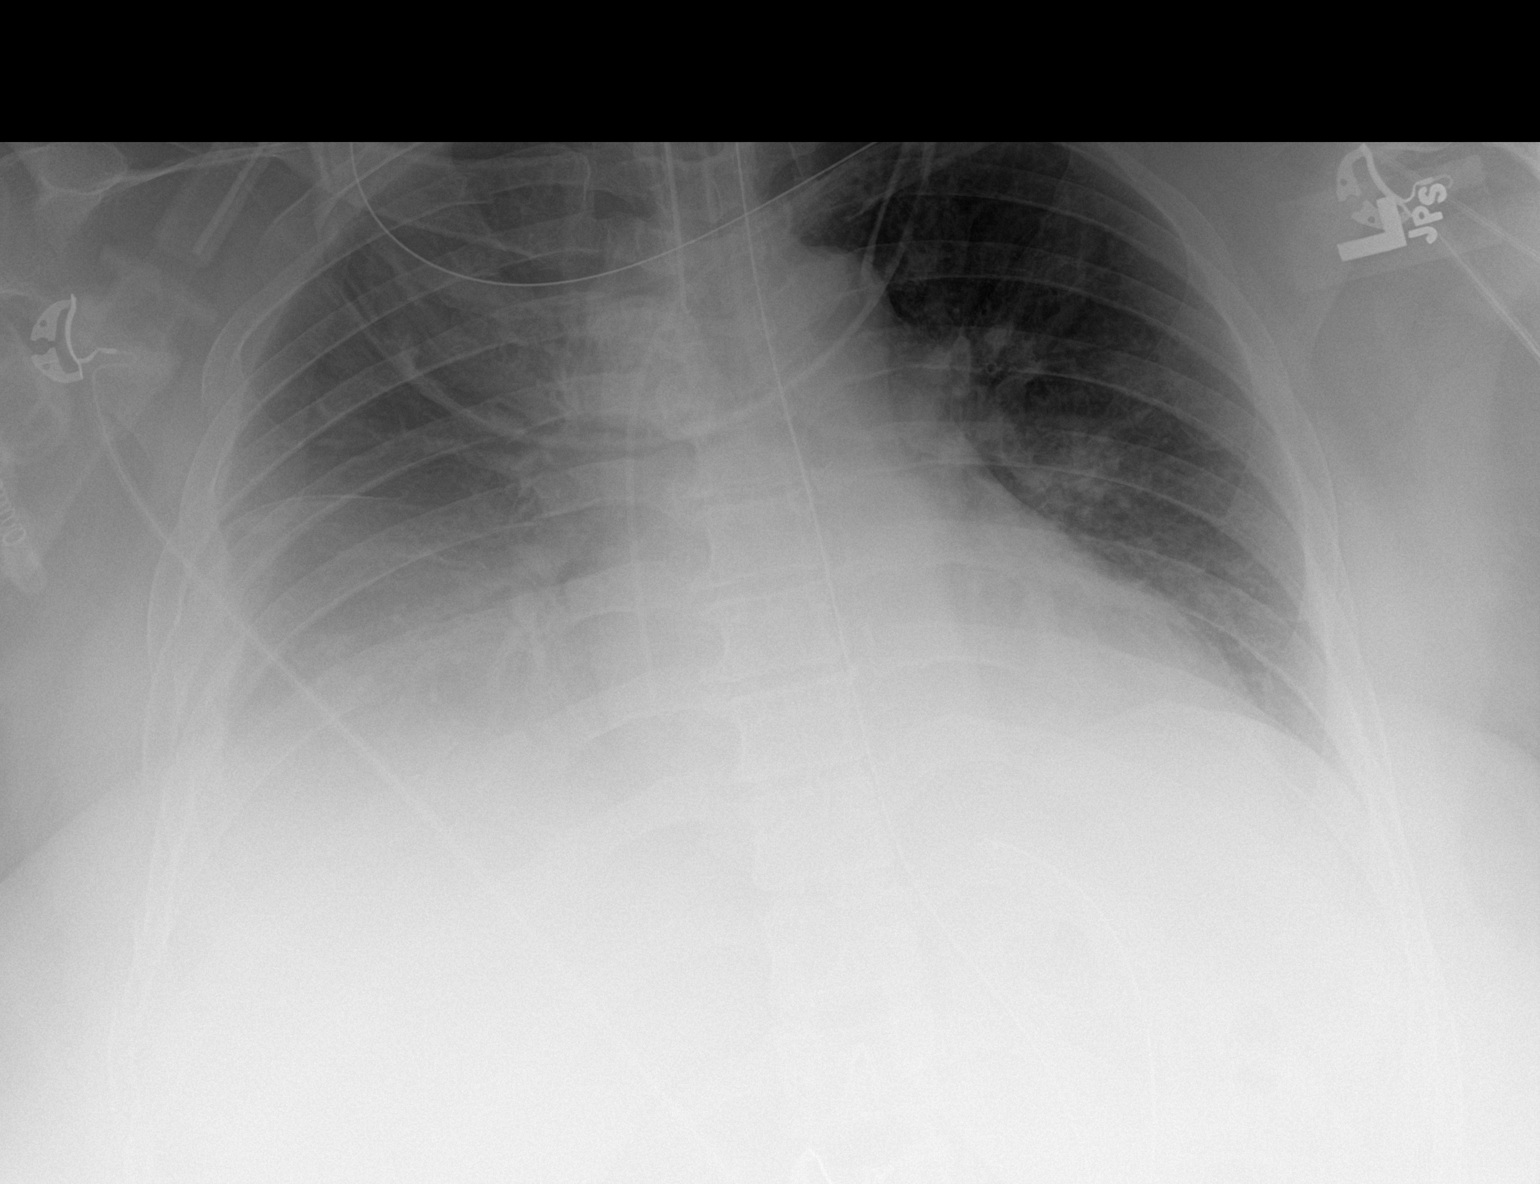

[1 of 1 positions shown; findings below may reference images not displayed]

FINDINGS: Endotracheal tube in good position. Central venous catheter tip at
the cavoatrial junction unchanged. NG tube in the stomach.

Right pleural effusion and right lower lobe airspace disease
unchanged. Right rib fractures. Mild left lower lobe atelectasis
also unchanged.
IMPRESSION: Moderate right effusion and right lower lobe atelectasis unchanged.
Right rib fractures.

Support apparatus in good position and unchanged.
